# Patient Record
Sex: Male | Born: 1990 | Race: White | Hispanic: No | Marital: Single | Smoking: Never smoker
Health system: Southern US, Community
[De-identification: ages and names within clinical notes are randomized; demographics above are authoritative.]

## PROBLEM LIST (undated history)

## (undated) DIAGNOSIS — G43909 Migraine, unspecified, not intractable, without status migrainosus: Secondary | ICD-10-CM

## (undated) DIAGNOSIS — Z9889 Other specified postprocedural states: Secondary | ICD-10-CM

## (undated) DIAGNOSIS — J9811 Atelectasis: Secondary | ICD-10-CM

## (undated) DIAGNOSIS — G2581 Restless legs syndrome: Secondary | ICD-10-CM

## (undated) DIAGNOSIS — N35919 Unspecified urethral stricture, male, unspecified site: Secondary | ICD-10-CM

## (undated) DIAGNOSIS — K8020 Calculus of gallbladder without cholecystitis without obstruction: Secondary | ICD-10-CM

## (undated) DIAGNOSIS — J45909 Unspecified asthma, uncomplicated: Secondary | ICD-10-CM

## (undated) DIAGNOSIS — R251 Tremor, unspecified: Secondary | ICD-10-CM

## (undated) DIAGNOSIS — Z8739 Personal history of other diseases of the musculoskeletal system and connective tissue: Secondary | ICD-10-CM

## (undated) DIAGNOSIS — I7774 Dissection of vertebral artery: Secondary | ICD-10-CM

## (undated) DIAGNOSIS — E559 Vitamin D deficiency, unspecified: Secondary | ICD-10-CM

## (undated) DIAGNOSIS — G4700 Insomnia, unspecified: Secondary | ICD-10-CM

## (undated) DIAGNOSIS — F32A Depression, unspecified: Secondary | ICD-10-CM

## (undated) DIAGNOSIS — K219 Gastro-esophageal reflux disease without esophagitis: Secondary | ICD-10-CM

## (undated) HISTORY — PX: CHEST TUBE INSERTION: SHX231

## (undated) HISTORY — PX: URETHRAL FISTULA REPAIR: SHX2619

## (undated) HISTORY — PX: WISDOM TOOTH EXTRACTION: SHX21

## (undated) HISTORY — PX: URETHRA SURGERY: SHX824

## (undated) HISTORY — PX: URETHRAL STRICTURE DILATATION: SHX477

---
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
## 2008-04-12 HISTORY — PX: CHEST TUBE INSERTION: SHX231

## 2008-07-16 ENCOUNTER — Ambulatory Visit: Payer: Self-pay | Admitting: Diagnostic Radiology

## 2008-07-16 ENCOUNTER — Emergency Department (HOSPITAL_BASED_OUTPATIENT_CLINIC_OR_DEPARTMENT_OTHER): Admission: EM | Admit: 2008-07-16 | Discharge: 2008-07-16 | Payer: Self-pay | Admitting: Emergency Medicine

## 2010-12-16 ENCOUNTER — Ambulatory Visit (HOSPITAL_BASED_OUTPATIENT_CLINIC_OR_DEPARTMENT_OTHER)
Admission: RE | Admit: 2010-12-16 | Discharge: 2010-12-16 | Disposition: A | Discharge status: Home / Self Care | Payer: Managed Care, Other (non HMO) | Source: Ambulatory Visit | Attending: Urology | Admitting: Urology

## 2010-12-16 DIAGNOSIS — N35919 Unspecified urethral stricture, male, unspecified site: Secondary | ICD-10-CM | POA: Insufficient documentation

## 2010-12-16 DIAGNOSIS — Q549 Hypospadias, unspecified: Secondary | ICD-10-CM | POA: Insufficient documentation

## 2010-12-16 DIAGNOSIS — Z01812 Encounter for preprocedural laboratory examination: Secondary | ICD-10-CM | POA: Insufficient documentation

## 2010-12-16 LAB — POCT HEMOGLOBIN-HEMACUE: Hemoglobin: 14.3 g/dL (ref 13.0–17.0)
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
## 2011-04-01 ENCOUNTER — Emergency Department: Payer: Self-pay | Admitting: Unknown Physician Specialty

## 2011-10-05 ENCOUNTER — Emergency Department: Payer: Self-pay | Admitting: Emergency Medicine

## 2011-10-05 LAB — BASIC METABOLIC PANEL
BUN: 12 mg/dL (ref 7–18)
Calcium, Total: 8.9 mg/dL (ref 8.5–10.1)
Chloride: 103 mmol/L (ref 98–107)
Creatinine: 0.99 mg/dL (ref 0.60–1.30)
EGFR (African American): >60
EGFR (Non-African Amer.): >60
Potassium: 3.3 mmol/L — ABNORMAL LOW (ref 3.5–5.1)

## 2011-10-05 LAB — CBC WITH DIFFERENTIAL/PLATELET
Basophil #: 0 10*3/uL (ref 0.0–0.1)
Eosinophil %: 2.5 %
HCT: 38.9 % — ABNORMAL LOW (ref 40.0–52.0)
Lymphocyte #: 1.6 10*3/uL (ref 1.0–3.6)
Lymphocyte %: 28.1 %
MCH: 30 pg (ref 26.0–34.0)
MCHC: 34.7 g/dL (ref 32.0–36.0)
MCV: 87 fL (ref 80–100)
Monocyte %: 8.1 %
Neutrophil %: 60.9 %
Platelet: 159 10*3/uL (ref 150–440)
RBC: 4.49 10*6/uL (ref 4.40–5.90)
RDW: 13.2 % (ref 11.5–14.5)

## 2011-10-06 ENCOUNTER — Ambulatory Visit: Payer: Self-pay | Admitting: Orthopedic Surgery

## 2012-02-17 ENCOUNTER — Emergency Department: Payer: Self-pay | Admitting: Emergency Medicine

## 2012-02-17 LAB — CBC
MCV: 88 fL (ref 80–100)
Platelet: 180 10*3/uL (ref 150–440)
RBC: 4.73 10*6/uL (ref 4.40–5.90)
RDW: 13.2 % (ref 11.5–14.5)
WBC: 4.3 10*3/uL (ref 3.8–10.6)

## 2012-02-17 LAB — COMPREHENSIVE METABOLIC PANEL
Anion Gap: 5 — ABNORMAL LOW (ref 7–16)
BUN: 11 mg/dL (ref 7–18)
Calcium, Total: 9.3 mg/dL (ref 8.5–10.1)
Chloride: 106 mmol/L (ref 98–107)
Co2: 32 mmol/L (ref 21–32)
EGFR (African American): >60
EGFR (Non-African Amer.): >60
Glucose: 99 mg/dL (ref 65–99)
Potassium: 4.6 mmol/L (ref 3.5–5.1)
SGOT(AST): 24 U/L (ref 15–37)
Total Protein: 7 g/dL (ref 6.4–8.2)

## 2012-02-17 LAB — LIPASE, BLOOD: Lipase: 222 U/L (ref 73–393)

## 2012-02-17 LAB — URINALYSIS, COMPLETE
Bilirubin,UR: NEGATIVE
Ketone: NEGATIVE
Ph: 9 (ref 4.5–8.0)
Protein: NEGATIVE
RBC,UR: <1 /HPF (ref 0–5)
Specific Gravity: 1.01 (ref 1.003–1.030)
Squamous Epithelial: <1

## 2014-08-04 NOTE — Consult Note (Signed)
Brief Consult Note: Diagnosis: Left knee/thigh pain.   Patient was seen by consultant.   Recommend further assessment or treatment.   Orders entered.   Discussed with Attending MD.   Comments: Discussed case with Dr. Dorothea GlassmanPaul Malinda in ER.  Patient is a 24y/o male who had a heavy pallet fall on a flexed leftknee today at work.  Patient had pain in the left knee and was unable to walk after the injury.  In the ER, patient describes pain in the distal 1/3 of the left thigh.  He has mild paresthesias in his foot.   On exam of the left thigh, patient has a linear area of erythema where he was hit by the pallet.  There is no obvious deformity.  He has no sigificant swelling and his leg and thight compartments are soft.  He has point tenderness of the distal femur in the area of the quadriceps tendon.  He has intact senation to light touch, palpable pedal pulses and can flex and extend his toes.  His knee is most comfortable in a flexed position of approximately 90 degrees.  He has pain when he attempts to extend his knee.  There is no ecchymosis.  Radiographs of the knee and distal femur show no evidence of fracture or dislocation.  Dr. Darnelle CatalanMalinda noted an inability of the patient to actively hold the knee extended when he first presented.  Given this information and the location of impact on the left thigh, an injury to the quadriceps tendon is possible.  The hosptial was unable to get an MRI at this time, so the patient will be discharged in a knee immobilizer on crutches and get an MRI in the AM as an outpatient.  He will follow-up in my office within the next 7-10 days when we will reexamine the knee and review the MRI results with the patient.  Electronic Signatures: [name], Phillip Patterson (MD)  (Signed 25-Jun-13 20:29)  Authored: Brief Consult Note   Last Updated: 25-Jun-13 20:29 by [name], [name] (MD)

## 2014-11-20 ENCOUNTER — Other Ambulatory Visit: Payer: Self-pay | Admitting: Surgery

## 2014-11-20 DIAGNOSIS — R590 Localized enlarged lymph nodes: Secondary | ICD-10-CM

## 2014-11-20 DIAGNOSIS — M79621 Pain in right upper arm: Secondary | ICD-10-CM

## 2014-11-26 ENCOUNTER — Ambulatory Visit
Admission: RE | Admit: 2014-11-26 | Discharge: 2014-11-26 | Disposition: A | Discharge status: Home / Self Care | Payer: BLUE CROSS/BLUE SHIELD | Source: Ambulatory Visit | Attending: Surgery | Admitting: Surgery

## 2014-11-26 DIAGNOSIS — M79621 Pain in right upper arm: Secondary | ICD-10-CM

## 2014-11-26 DIAGNOSIS — R590 Localized enlarged lymph nodes: Secondary | ICD-10-CM | POA: Insufficient documentation

## 2014-12-06 ENCOUNTER — Other Ambulatory Visit: Payer: Self-pay | Admitting: Surgery

## 2014-12-06 ENCOUNTER — Ambulatory Visit
Admission: RE | Admit: 2014-12-06 | Discharge: 2014-12-06 | Disposition: A | Discharge status: Home / Self Care | Payer: BLUE CROSS/BLUE SHIELD | Source: Ambulatory Visit | Attending: Surgery | Admitting: Surgery

## 2014-12-06 DIAGNOSIS — Z9889 Other specified postprocedural states: Secondary | ICD-10-CM | POA: Insufficient documentation

## 2014-12-06 DIAGNOSIS — M79601 Pain in right arm: Secondary | ICD-10-CM

## 2015-04-04 ENCOUNTER — Ambulatory Visit
Admission: EM | Admit: 2015-04-04 | Discharge: 2015-04-04 | Disposition: A | Discharge status: Home / Self Care | Payer: BLUE CROSS/BLUE SHIELD | Attending: Family Medicine | Admitting: Family Medicine

## 2015-04-04 ENCOUNTER — Encounter: Payer: Self-pay | Admitting: Emergency Medicine

## 2015-04-04 DIAGNOSIS — J018 Other acute sinusitis: Secondary | ICD-10-CM | POA: Diagnosis not present

## 2015-04-04 MED ORDER — AMOXICILLIN 500 MG PO CAPS: 500.0000 mg | ORAL_CAPSULE | Freq: Three times a day (TID) | ORAL | Status: DC

## 2015-04-04 NOTE — ED Provider Notes (Signed)
Patient presents today with symptoms of sinus congestion, sinus pressure for the last week. Patient has had colored mucus and blood from the nose. He denies any sore throat or fever. He has had some minimal cough. Denies any chest pain, shortness of breath, nausea, vomiting, diarrhea. He believes his symptoms started after taking care of his brothers cats. He denies allergies to cats in the past. He has taken DayQuil for his symptoms.  ROS: Negative except mentioned above.  Vitals as per Epic. GENERAL: NAD HEENT: no pharyngeal erythema, no exudate, no erythema of TMs, mild maxillary sinus tenderness, small blood clot in right nare, no active bleeding, no cervical LAD RESP: CTA B CARD: RRR NEURO: CN II-XII grossly intact   A/P: Sinusitis- Amoxicillin, Claritin-D when necessary, rest, hydration, seek medical attention if symptoms persist or worsen as discussed. Will also try using a humidifier.  Jolene ProvostKirtida Johnye Kist, MD 04/04/15 1700

## 2015-04-04 NOTE — ED Notes (Signed)
Congestion, pressure, drainage for 1 week

## 2016-11-14 ENCOUNTER — Emergency Department
Admission: EM | Admit: 2016-11-14 | Discharge: 2016-11-14 | Disposition: A | Discharge status: Home / Self Care | Payer: BLUE CROSS/BLUE SHIELD | Attending: Emergency Medicine | Admitting: Emergency Medicine

## 2016-11-14 ENCOUNTER — Emergency Department: Payer: BLUE CROSS/BLUE SHIELD

## 2016-11-14 ENCOUNTER — Encounter: Payer: Self-pay | Admitting: Emergency Medicine

## 2016-11-14 DIAGNOSIS — S29019A Strain of muscle and tendon of unspecified wall of thorax, initial encounter: Secondary | ICD-10-CM | POA: Diagnosis not present

## 2016-11-14 DIAGNOSIS — Y999 Unspecified external cause status: Secondary | ICD-10-CM | POA: Insufficient documentation

## 2016-11-14 DIAGNOSIS — Y929 Unspecified place or not applicable: Secondary | ICD-10-CM | POA: Diagnosis not present

## 2016-11-14 DIAGNOSIS — S39012A Strain of muscle, fascia and tendon of lower back, initial encounter: Secondary | ICD-10-CM | POA: Insufficient documentation

## 2016-11-14 DIAGNOSIS — Y939 Activity, unspecified: Secondary | ICD-10-CM | POA: Insufficient documentation

## 2016-11-14 DIAGNOSIS — S3992XA Unspecified injury of lower back, initial encounter: Secondary | ICD-10-CM | POA: Diagnosis present

## 2016-11-14 DIAGNOSIS — X500XXA Overexertion from strenuous movement or load, initial encounter: Secondary | ICD-10-CM | POA: Insufficient documentation

## 2016-11-14 MED ORDER — METHOCARBAMOL 500 MG PO TABS: ORAL_TABLET | ORAL | 0 refills | Status: DC

## 2016-11-14 MED ORDER — IBUPROFEN 800 MG PO TABS
800.0000 mg | ORAL_TABLET | Freq: Once | ORAL | Status: AC
  Administered 2016-11-14: 800 mg via ORAL
  Filled 2016-11-14: qty 1

## 2016-11-14 MED ORDER — IBUPROFEN 600 MG PO TABS: 600.0000 mg | ORAL_TABLET | Freq: Three times a day (TID) | ORAL | 0 refills | Status: DC | PRN

## 2016-11-14 NOTE — ED Notes (Signed)
Patient transported to X-ray 

## 2016-11-14 NOTE — ED Triage Notes (Addendum)
Back pain since yesterday, states he does a lot of heavy lifting at work and does not remember injuring his back. Pt states it is uncomfortable for an position to sit in. Pt c/o mid to lower back pain that radiates to neck. Pain is worse with movement. Denies any numbness or tingling in lower extremities and no problems with voiding. Pt ambulatory in lobby

## 2016-11-14 NOTE — Discharge Instructions (Signed)
Follow-[facility] with your [facility] care doctor at [facility] if any continued problems. Begin taking Robaxin 1-2 tablets every 6 hours as needed for muscle spasms. Do not drive while taking this medication. Ibuprofen 600 mg 3 times a day with food. Use moist heat or ice to your back as needed for comfort.
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
## 2017-04-12 DIAGNOSIS — M359 Systemic involvement of connective tissue, unspecified: Secondary | ICD-10-CM

## 2017-04-12 HISTORY — DX: Systemic involvement of connective tissue, unspecified: M35.9

## 2017-08-19 ENCOUNTER — Emergency Department
Admission: EM | Admit: 2017-08-19 | Discharge: 2017-08-19 | Disposition: A | Discharge status: Home / Self Care | Payer: BLUE CROSS/BLUE SHIELD | Attending: Emergency Medicine | Admitting: Emergency Medicine

## 2017-08-19 ENCOUNTER — Encounter: Payer: Self-pay | Admitting: Emergency Medicine

## 2017-08-19 DIAGNOSIS — R55 Syncope and collapse: Secondary | ICD-10-CM | POA: Diagnosis present

## 2017-08-19 LAB — CBC
HCT: 42.2 % (ref 40.0–52.0)
HEMOGLOBIN: 14.9 g/dL (ref 13.0–18.0)
MCH: 31.3 pg (ref 26.0–34.0)
MCHC: 35.2 g/dL (ref 32.0–36.0)
MCV: 88.8 fL (ref 80.0–100.0)
Platelets: 199 10*3/uL (ref 150–440)
RBC: 4.76 MIL/uL (ref 4.40–5.90)
RDW: 13.1 % (ref 11.5–14.5)
WBC: 6.2 10*3/uL (ref 3.8–10.6)

## 2017-08-19 LAB — BASIC METABOLIC PANEL
ANION GAP: 8 (ref 5–15)
BUN: 14 mg/dL (ref 6–20)
CO2: 28 mmol/L (ref 22–32)
Calcium: 9.2 mg/dL (ref 8.9–10.3)
Chloride: 102 mmol/L (ref 101–111)
Creatinine, Ser: 0.77 mg/dL (ref 0.61–1.24)
GFR calc Af Amer: >60 mL/min (ref >60)
GLUCOSE: 100 mg/dL — AB (ref 65–99)
Potassium: 3.6 mmol/L (ref 3.5–5.1)
Sodium: 138 mmol/L (ref 135–145)

## 2017-08-19 LAB — TSH: TSH: 1.224 u[IU]/mL (ref 0.350–4.500)

## 2017-08-19 LAB — TROPONIN I

## 2017-08-19 MED ORDER — SODIUM CHLORIDE 0.9 % IV BOLUS
1000.0000 mL | Freq: Once | INTRAVENOUS | Status: AC
  Administered 2017-08-19: 1000 mL via INTRAVENOUS

## 2017-08-19 NOTE — ED Triage Notes (Signed)
Patient presents to the ED via EMS for near syncopal episode while at work.  Patient works in Network engineer.  Patient felt like he was having palpitations and became diaphoretic and confused per other staff members at patient's work.  Patient states this has happened to him before.  Patient states he has been more shaky over the last few months, usually feels better after he eats.  Patient's sister who is 29 years old was recently diagnosed with afib.

## 2017-08-19 NOTE — ED Provider Notes (Signed)
[facility] Emergency Department Provider Note  ____________________________________________  Time seen: Approximately 1:55 PM  I have reviewed the triage vital signs and the nursing notes.   HISTORY  Chief Complaint Near Syncope   HPI Phillip Patterson is a 27 y.o. male no significant past medical history who presents for evaluation of a syncopal episode.  Patient reports that he was at work when he started having palpitations,/fluttering sensation in his chest associated with mild chest pressure in the center of his chest and dizziness.  He started feeling very hot.  He went to his boss's office and sat down.  He then had a syncopal episode while sitting on a chair.  He does not sustain any injuries.  Next thing he remembers is waking up with the paramedics at the bedside.  Patient reports that he has been having episodes where he feels very shaky and that usually resolves after he eats something at least 3 times a week for several weeks.  Never had a syncopal episode and never had fluttering before. His sister who is 67 years old was recently diagnosed with A. fib.  He endorses polyuria and polydipsia.  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  He works indoors.  He tries to keep himself hydrated.  He reports drinking 1 alcoholic beverage a week. He does not smoke and denies drug use.  No family history of sudden death or congenital deafness.  He does have a strong family history of diabetes.  He denies any family history of ischemic heart disease or blood clots.  He denies any chest pain at this time.  Patient reports that he feels back to baseline at this time with no palpitations, no dizziness, no chest pain.  PMH Hypospadia   Past Surgical History:  Procedure Laterality Date  . CHEST TUBE INSERTION    . URETHRA SURGERY      Prior to Admission medications   Not on File    Allergies Aspirin  FH Mother- DM, HTN Sister -  afib  Social History Social History   Tobacco Use  . Smoking status: Never Smoker  . Smokeless tobacco: Never Used  Substance Use Topics  . Alcohol use: Yes  . Drug use: No    Review of Systems  Constitutional: Negative for fever. + Dizziness and syncope Eyes: Negative for visual changes. ENT: Negative for sore throat. Neck: No neck pain  Cardiovascular: + chest pressure, fluttering Respiratory: Negative for shortness of breath. Gastrointestinal: Negative for abdominal pain, vomiting or diarrhea. Genitourinary: Negative for dysuria. Musculoskeletal: Negative for back pain. Skin: Negative for rash. Neurological: Negative for headaches, weakness or numbness. Psych: No SI or HI  ____________________________________________   PHYSICAL EXAM:  VITAL SIGNS: ED Triage Vitals [08/19/17 1337]  Enc Vitals Group     BP 137/74     Pulse Rate 68     Resp 16     Temp 97.9 F (36.6 C)     Temp Source Oral     SpO2 96 %     Weight 132 lb 7.9 oz (60.1 kg)     Height  (1.854 m)     Head Circumference      Peak Flow      Pain Score 0     Pain Loc      Pain Edu?      Excl. in GC?    Orthostatic VS: Laying: HR 65  BP 120/86 Standing:  HR 75  BP 126/85  Constitutional: Alert and oriented. Well appearing and in no apparent distress. HEENT:      Head: Normocephalic and atraumatic.         Eyes: Conjunctivae are normal. Sclera is non-icteric.       Mouth/Throat: Mucous membranes are moist.       Neck: Supple with no signs of meningismus. Cardiovascular: Regular rate and rhythm. No murmurs, gallops, or rubs. 2+ symmetrical distal pulses are present in all extremities. No JVD. Respiratory: Normal respiratory effort. Lungs are clear to auscultation bilaterally. No wheezes, crackles, or rhonchi.  Gastrointestinal: Soft, non tender, and non distended with positive bowel sounds. No rebound or guarding. Musculoskeletal: Nontender with normal range of motion in all extremities. No  edema, cyanosis, or erythema of extremities. Neurologic: Normal speech and language. Face is symmetric. Moving all extremities. No gross focal neurologic deficits are appreciated. Skin: Skin is warm, dry and intact. No rash noted. Psychiatric: Mood and affect are normal. Speech and behavior are normal.  ____________________________________________   LABS (all labs ordered are listed, but only abnormal results are displayed)  Labs Reviewed  BASIC METABOLIC PANEL - Abnormal; Notable for the following components:      Result Value   Glucose, Bld 100 (*)    All other components within normal limits  CBC  TROPONIN I  TSH  URINALYSIS, COMPLETE (UACMP) WITH MICROSCOPIC  URINE DRUG SCREEN, QUALITATIVE ([facility] ONLY)   ____________________________________________  EKG  ED ECG REPORT I, Nita Sickle, the attending physician, personally viewed and interpreted this ECG.  Normal sinus rhythm, normal intervals, normal axis, no STE or depressions, no evidence of HOCM, AV block, delta wave, ARVD, prolonged QTc, WPW, or Brugada.   ____________________________________________  RADIOLOGY  none  ____________________________________________   PROCEDURES  Procedure(s) performed: None Procedures Critical Care performed:  None ____________________________________________   INITIAL IMPRESSION / ASSESSMENT AND PLAN / ED COURSE   27 y.o. male no significant past medical history who presents for evaluation of a syncopal episode preceded by dizziness, fluttering sensation in his chest and chest pressure.  Patient is asymptomatic at this time.  Orthostatic vital signs show change in his heart rate by 10 bpm but stable BP. Patient did not feel dizzy. EKG showing no evidence of ischemia or dysrhythmias.  Will monitor patient on cardiac telemetry.  Will check labs to rule out electrolyte abnormalities, anemia, dehydration, new onset of diabetes, or cardiac ischemia.  Will give IV fluids.  If work-up  is negative will discharge patient with follow-up with cardiology.  Differential diagnosis including vasovagal versus orthostatic dehydration versus cardiac arrhythmia versus anemia.    _________________________ 3:23 PM on 08/19/2017 -----------------------------------------  I have personally review patient's telemetry with no evidence of arrhythmia.  Patient was monitored for 2 hours.  Labs including CBC, BMP, troponin, TSH are all within normal limits.  Patient is going to be referred to cardiology for further monitoring.  Discussed return precautions with him.   As part of my medical decision making, I reviewed the following data within the electronic MEDICAL RECORD NUMBER Nursing notes reviewed and incorporated, Labs reviewed , EKG interpreted , Notes from prior ED visits and [place] Controlled Substance Database    Pertinent labs & imaging results that were available during my care of the patient were reviewed by me and considered in my medical decision making (see chart for details).    ____________________________________________   FINAL CLINICAL IMPRESSION(S) / ED DIAGNOSES  Final diagnoses:  Syncope, unspecified syncope type  NEW MEDICATIONS STARTED DURING THIS VISIT:  ED Discharge Orders    None       Note:  This document was prepared using Dragon voice recognition software and may include unintentional dictation errors.    Nita Sickle, MD 08/19/17 [phone]

## 2018-02-06 ENCOUNTER — Other Ambulatory Visit: Payer: Self-pay | Admitting: Family Medicine

## 2018-02-06 DIAGNOSIS — M542 Cervicalgia: Secondary | ICD-10-CM

## 2018-02-08 ENCOUNTER — Ambulatory Visit
Admission: RE | Admit: 2018-02-08 | Discharge: 2018-02-08 | Disposition: A | Discharge status: Home / Self Care | Payer: BLUE CROSS/BLUE SHIELD | Source: Ambulatory Visit | Attending: Family Medicine | Admitting: Family Medicine

## 2018-02-08 ENCOUNTER — Other Ambulatory Visit: Payer: Self-pay

## 2018-02-08 ENCOUNTER — Emergency Department
Admission: EM | Admit: 2018-02-08 | Discharge: 2018-02-08 | Disposition: A | Discharge status: Home / Self Care | Payer: BLUE CROSS/BLUE SHIELD | Attending: Emergency Medicine | Admitting: Emergency Medicine

## 2018-02-08 ENCOUNTER — Encounter: Payer: Self-pay | Admitting: Emergency Medicine

## 2018-02-08 ENCOUNTER — Encounter (INDEPENDENT_AMBULATORY_CARE_PROVIDER_SITE_OTHER): Payer: Self-pay

## 2018-02-08 DIAGNOSIS — I7774 Dissection of vertebral artery: Secondary | ICD-10-CM

## 2018-02-08 DIAGNOSIS — M5412 Radiculopathy, cervical region: Secondary | ICD-10-CM

## 2018-02-08 DIAGNOSIS — M542 Cervicalgia: Secondary | ICD-10-CM | POA: Diagnosis present

## 2018-02-08 HISTORY — DX: Atelectasis: J98.11

## 2018-02-08 LAB — CBC WITH DIFFERENTIAL/PLATELET
ABS IMMATURE GRANULOCYTES: 0.02 10*3/uL (ref 0.00–0.07)
BASOS ABS: 0 10*3/uL (ref 0.0–0.1)
Basophils Relative: 0 %
Eosinophils Absolute: 0.2 10*3/uL (ref 0.0–0.5)
Eosinophils Relative: 4 %
HCT: 42.4 % (ref 39.0–52.0)
HEMOGLOBIN: 14.6 g/dL (ref 13.0–17.0)
IMMATURE GRANULOCYTES: 0 %
LYMPHS PCT: 35 %
Lymphs Abs: 2 10*3/uL (ref 0.7–4.0)
MCH: 30.5 pg (ref 26.0–34.0)
MCHC: 34.4 g/dL (ref 30.0–36.0)
MCV: 88.5 fL (ref 80.0–100.0)
MONO ABS: 0.5 10*3/uL (ref 0.1–1.0)
Monocytes Relative: 10 %
NEUTROS ABS: 2.8 10*3/uL (ref 1.7–7.7)
NEUTROS PCT: 51 %
Platelets: 228 10*3/uL (ref 150–400)
RBC: 4.79 MIL/uL (ref 4.22–5.81)
RDW: 12.1 % (ref 11.5–15.5)
WBC: 5.6 10*3/uL (ref 4.0–10.5)
nRBC: 0 % (ref 0.0–0.2)

## 2018-02-08 LAB — TYPE AND SCREEN
ABO/RH(D): O POS
Antibody Screen: NEGATIVE

## 2018-02-08 LAB — BASIC METABOLIC PANEL
ANION GAP: 3 — AB (ref 5–15)
BUN: 11 mg/dL (ref 6–20)
CHLORIDE: 102 mmol/L (ref 98–111)
CO2: 34 mmol/L — ABNORMAL HIGH (ref 22–32)
Calcium: 9.1 mg/dL (ref 8.9–10.3)
Creatinine, Ser: 0.82 mg/dL (ref 0.61–1.24)
Glucose, Bld: 89 mg/dL (ref 70–99)
POTASSIUM: 3.6 mmol/L (ref 3.5–5.1)
SODIUM: 139 mmol/L (ref 135–145)

## 2018-02-08 MED ORDER — CLOPIDOGREL BISULFATE 75 MG PO TABS: 75.0000 mg | ORAL_TABLET | Freq: Every day | ORAL | 2 refills | Status: AC

## 2018-02-08 MED ORDER — ASPIRIN EC 325 MG PO TBEC: 325.0000 mg | DELAYED_RELEASE_TABLET | Freq: Every day | ORAL | 3 refills | Status: AC

## 2018-02-08 MED ORDER — ASPIRIN 81 MG PO CHEW
CHEWABLE_TABLET | ORAL | Status: AC
  Administered 2018-02-08: 324 mg
  Filled 2018-02-08: qty 4

## 2018-02-08 MED ORDER — CLOPIDOGREL BISULFATE 75 MG PO TABS
75.0000 mg | ORAL_TABLET | Freq: Once | ORAL | Status: AC
  Administered 2018-02-08: 75 mg via ORAL
  Filled 2018-02-08: qty 1

## 2018-02-08 NOTE — ED Notes (Signed)
Patient discharged to home per MD order. Patient in stable condition, and deemed medically cleared by ED provider for discharge. Discharge instructions reviewed with patient/family using "Teach Back"; verbalized understanding of medication education and administration, and information about follow-up care. Denies further concerns. ° °

## 2018-02-08 NOTE — ED Triage Notes (Signed)
Dr. Yves Dill, Ortho MD called and reported pt with c/o left neck pain with no injury. Pt received a MRI and the was called today and advised to come to the ED for a vertebral artery dissection.

## 2018-02-08 NOTE — ED Triage Notes (Signed)
Here for large left artery dissection. Has had neck pain X 1 month and found with MRI today. Neck pain intermittent. VSS at this time.

## 2018-02-08 NOTE — ED Notes (Signed)
Pt states he has had neck pain states to right and left side for 1 month. Denies any injury, has seen pmd for the same and given muscles relaxants and steroids without relief. States is on neurontin x 3 days which has helped with neck pain. Pt states he had MRI earlier today outpt per pmd and was called at home to come to ED to due abnormal results. 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

## 2018-02-08 NOTE — Discharge Instructions (Addendum)
Please return to the emergency department for any numbness or weakness in any extremity, slurred speech or change in vision.  Please seek medical attention for any head trauma while you are taking this medication or for any excessive bleeding.

## 2018-02-10 NOTE — ED Provider Notes (Signed)
[facility] Emergency Department Provider Note  ____________________________________________   I have reviewed the triage vital signs and the nursing notes.   HISTORY  Chief Complaint Neck Pain   History limited by: Not Limited   HPI Phillip Patterson is a 27 y.o. male who presents to the emergency department today because of concerns for vertebral artery dissection found on the cervical MRI performed today.  Patient states that for roughly the past month he has been having left neck pain.  He denies any trauma prior to the neck pain starting.  When the pain first started he did have some left shoulder pain and some left arm numbness although the shoulder and arm numbness had resolved fairly quickly.  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   Per medical record review patient has a history of pneumothorax  Past Medical History:  Diagnosis Date  . Collapse of both lungs     There are no active problems to display for this patient.   Past Surgical History:  Procedure Laterality Date  . CHEST TUBE INSERTION    . URETHRA SURGERY    . URETHRAL FISTULA REPAIR      Prior to Admission medications   Medication Sig Start Date End Date Taking? Authorizing Provider  cyclobenzaprine (FLEXERIL) 5 MG tablet Take 1 tablet by mouth 3 (three) times daily as needed. 01/30/18  Yes [provider]  gabapentin (NEURONTIN) 300 MG capsule Take 1 capsule by mouth 3 (three) times daily. 02/06/18  Yes [provider]  HYDROcodone-acetaminophen (NORCO/VICODIN) 5-325 MG tablet Take 1 tablet by mouth every 6 (six) hours as needed. 01/30/18  Yes [provider]  tiZANidine (ZANAFLEX) 4 MG tablet Take 1 tablet by mouth 3 (three) times daily as needed. 01/24/18  Yes [provider]  traMADol (ULTRAM) 50 MG tablet Take 1 tablet by mouth 2 (two) times daily  as needed. 02/06/18  Yes [provider]  aspirin EC 325 MG tablet Take 1 tablet (325 mg total) by mouth daily. 02/08/18 02/08/19  Phineas Semen, MD  clopidogrel (PLAVIX) 75 MG tablet Take 1 tablet (75 mg total) by mouth daily. 02/08/18 02/08/19  Phineas Semen, MD  predniSONE (DELTASONE) 10 MG tablet Take 1 tablet by mouth as directed. Taper 01/24/18   [provider]    Allergies Aspirin  History reviewed. No pertinent family history.  Social History Social History   Tobacco Use  . Smoking status: Never Smoker  . Smokeless tobacco: Never Used  Substance Use Topics  . Alcohol use: Yes  . Drug use: No    Review of Systems Constitutional: No fever/chills Eyes: No visual changes. ENT: Positive for left neck pain. Cardiovascular: Denies chest pain. Respiratory: Denies shortness of breath. Gastrointestinal: No abdominal pain.  No nausea, no vomiting.  No diarrhea.   Genitourinary: Negative for dysuria. Musculoskeletal: Negative for back pain. Skin: Negative for rash. Neurological: Negative for headaches, focal weakness or numbness.  ____________________________________________   PHYSICAL EXAM:  VITAL SIGNS: ED Triage Vitals [02/08/18 2145]  Enc Vitals Group     BP (!) 118/53     Pulse Rate 78     Resp 16     Temp      Temp src      SpO2 99 %     Weight 140 lb (63.5 kg)     Height 6\' 1"  (1.854 m)  Head Circumference      Peak Flow      Pain Score 2   Constitutional: Alert and oriented.  Eyes: Conjunctivae are normal.  ENT      Head: Normocephalic and atraumatic.      Nose: No congestion/rhinnorhea.      Mouth/Throat: Mucous membranes are moist.      Neck: No stridor. Hematological/Lymphatic/Immunilogical: No cervical lymphadenopathy. Cardiovascular: Normal rate, regular rhythm.  No murmurs, rubs, or gallops. Respiratory: Normal respiratory effort without tachypnea nor retractions. Breath sounds are clear and equal bilaterally. No  wheezes/rales/rhonchi. Gastrointestinal: Soft and non tender. No rebound. No guarding.  Genitourinary: Deferred Musculoskeletal: Normal range of motion in all extremities. No lower extremity edema. Neurologic:  Normal speech and language. No gross focal neurologic deficits are appreciated.  Skin:  Skin is warm, dry and intact. No rash noted. Psychiatric: Mood and affect are normal. Speech and behavior are normal. Patient exhibits appropriate insight and judgment.  ____________________________________________    LABS (pertinent positives/negatives)  CBC wnl BMP wnl except co2 34  ____________________________________________   EKG  None  ____________________________________________    RADIOLOGY  None  ____________________________________________   PROCEDURES  Procedures  ____________________________________________   INITIAL IMPRESSION / ASSESSMENT AND PLAN / ED COURSE  Pertinent labs & imaging results that were available during my care of the patient were reviewed by me and considered in my medical decision making (see chart for details).   Patient presented to the emergency department today after MRI showed vertebral artery dissection.  Is likely has been present for the past month.  Discussed with Dr. Wyn Quaker.  Patient is neurologically intact.  At this point Dr. Wyn Quaker recommends antiplatelet therapy and follow-up in clinic.  Discussed plan with patient.  Discussed with patient/family results of testing/physical exam, differential plan and return precautions.  ____________________________________________   FINAL CLINICAL IMPRESSION(S) / ED DIAGNOSES  Final diagnoses:  Dissection, vertebral artery (HCC)     Note: This dictation was prepared with Dragon dictation. Any transcriptional errors that result from this process are unintentional     Phineas Semen, MD 02/10/18 0011

## 2018-02-14 ENCOUNTER — Ambulatory Visit (INDEPENDENT_AMBULATORY_CARE_PROVIDER_SITE_OTHER): Payer: BLUE CROSS/BLUE SHIELD | Admitting: Vascular Surgery

## 2018-02-14 ENCOUNTER — Encounter (INDEPENDENT_AMBULATORY_CARE_PROVIDER_SITE_OTHER): Payer: Self-pay | Admitting: Vascular Surgery

## 2018-02-14 VITALS — BP 103/61 | HR 73 | Resp 17 | Ht 73.0 in | Wt 140.0 lb

## 2018-02-14 DIAGNOSIS — M542 Cervicalgia: Secondary | ICD-10-CM | POA: Diagnosis not present

## 2018-02-14 DIAGNOSIS — I7774 Dissection of vertebral artery: Secondary | ICD-10-CM | POA: Insufficient documentation

## 2018-02-14 NOTE — Assessment & Plan Note (Signed)
Neurontin seems to be helping this.  No obvious findings on his MR scan other than the vertebral artery dissection on the left
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
## 2018-02-14 NOTE — Patient Instructions (Signed)
Vertebral Artery Dissection  Vertebral artery dissection is a tear in a vertebral artery. The vertebral arteries are major blood vessels at the base of the neck. They carry blood from the heart to the brain. When an artery tears, blood collects inside the layers of the artery wall. This can cause a blood clot.  This condition increases the risk of stroke if it is not diagnosed and treated right away. It is a common cause of stroke in people who are 30-45 years old.  What are the causes?  This condition may be caused by:  · A neck injury due to sudden or excessive neck movement. The injury may be mild or severe.  · Having weak blood vessel walls. The walls may tear even when no injury occurs (spontaneous dissection).    What increases the risk?  You may be more likely to have a spontaneous vertebral artery dissection if you:  · Have high blood pressure.  · Have a migraine disorder.  · Have certain inherited diseases or connective tissue disorders that weaken the blood vessels.  · Have fibromuscular dysplasia.    What are the signs or symptoms?  Symptoms usually appear within days of an injury, but sometimes they may not appear for weeks or years. Symptoms may include:  · Stabbing, sharp pain in the head, neck, eye, or face.  · Vertigo. This is a feeling that you or your surroundings are moving when they are not.  · Dizziness.  · Double vision.  · Difficulty speaking.  · Difficulty swallowing.  · Hoarse voice.  · Loss of feeling in the torso, legs, or arms.  · Loss of taste.  · Loss of balance.  · Hiccups.  · Nausea and vomiting.  · Hearing loss.  · Ear pain.    How is this diagnosed?  This condition is diagnosed with tests, such as:  · CT angiogram. This test uses a computer to take X-rays of your vertebral arteries. A dye may be injected into your blood to show the inside of your blood vessels more clearly.  · MRI angiogram. This is a type of MRI that is used to evaluate the blood vessels.  · Cerebral angiogram.  This test uses X-rays and a dye to show the blood vessels in the brain and neck.  · Doppler ultrasound. This test uses sound waves to show the vertebral arteries. The test will also show how well blood flows through your arteries.    How is this treated?  Treatment for this condition depends on the cause of your vertebral artery dissection and your overall health. The most important goal of treatment is to prevent a stroke. If you are having a stroke, it is important to get treatment quickly. Treatment may include:  · Blood thinners. This medicine helps to prevent blood clots. This may be given first through an IV tube, and then as pills for 3-6 months.  · Procedures to widen a narrow blood vessel (angioplasty) or to place a mesh tube (stent) inside the blood vessel to keep it open.  · Surgery to repair the area. This is rarely needed.    Follow these instructions at home:  · Work with your health care provider to control your blood pressure. This may involve:  ? Exercising regularly, as told by your health care provider. Check with your health care provider before starting any new type of exercise.  ? Eating a heart-healthy diet. This includes limiting unhealthy fats and eating more healthy   fats.  ? Reducing the amount of salt (sodium) that you eat to less than 1,500 mg a day.  ? Reducing stress by participating in things that you enjoy and avoiding things that cause you stress.  · Avoid activities that put you at risk for neck injuries, such as contact sports.  · Take over-the-counter and prescription medicines only as told by your health care provider.  · Do not use any tobacco products, such as cigarettes, chewing tobacco, and e-cigarettes. If you need help quitting, ask your health care provider.  · Keep all follow-up visits as told by your health care provider. This is important.  Get help right away if:  · You feel weak or dizzy.  · You have a sudden, severe headache with no known cause.  · You notice changes  in your vision or speech.  · You have difficulty breathing.  · You have a loss of balance or coordination.  · You have numbness in your face, arm, or leg.  · You have chest pain.  · You have a fever.  These symptoms may represent a serious problem that is an emergency. Do not wait to see if the symptoms will go away. Get medical help right away. Call your local emergency services (911 in the [place] Do not drive yourself to the hospital.  This information is not intended to replace advice given to you by your health care provider. Make sure you discuss any questions you have with your health care provider.  Document Released: 03/15/2012 Document Revised: 09/09/2015 Document Reviewed: 06/25/2015  Elsevier Interactive Patient Education © 2018 [employer]
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
## 2018-02-22 ENCOUNTER — Ambulatory Visit
Admission: RE | Admit: 2018-02-22 | Discharge: 2018-02-22 | Disposition: A | Discharge status: Home / Self Care | Payer: BLUE CROSS/BLUE SHIELD | Source: Ambulatory Visit | Attending: Vascular Surgery | Admitting: Vascular Surgery

## 2018-02-22 DIAGNOSIS — I7774 Dissection of vertebral artery: Secondary | ICD-10-CM | POA: Insufficient documentation

## 2018-02-22 MED ORDER — IOPAMIDOL (ISOVUE-370) INJECTION 76%
100.0000 mL | Freq: Once | INTRAVENOUS | Status: AC | PRN
  Administered 2018-02-22: 80 mL via INTRAVENOUS

## 2018-03-17 ENCOUNTER — Encounter (INDEPENDENT_AMBULATORY_CARE_PROVIDER_SITE_OTHER): Payer: Self-pay | Admitting: Vascular Surgery

## 2018-03-17 ENCOUNTER — Ambulatory Visit (INDEPENDENT_AMBULATORY_CARE_PROVIDER_SITE_OTHER): Payer: BLUE CROSS/BLUE SHIELD | Admitting: Vascular Surgery

## 2018-03-17 VITALS — BP 109/69 | HR 63 | Resp 17 | Ht 73.0 in | Wt 142.4 lb

## 2018-03-17 DIAGNOSIS — M542 Cervicalgia: Secondary | ICD-10-CM

## 2018-03-17 DIAGNOSIS — I7774 Dissection of vertebral artery: Secondary | ICD-10-CM | POA: Diagnosis not present

## 2018-03-17 NOTE — Progress Notes (Signed)
MRN : [id number]  Phillip Patterson is a 27 y.o. (14-Oct-1990) male who presents with chief complaint of  Chief Complaint  Patient presents with  . Follow-up    ct results  .  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  He is doing well without complaints.  He says he stopped taking his Plavix because it made him feel bad and lowered his blood pressure.  He really does not want to take any medicines that he does not have to.  He reports no new focal neurologic symptoms.  Since his last visit, he has undergone a CT angiogram which I have independently reviewed.  He has a stable left vertebral artery dissection which has not changed from his previous study a few weeks earlier.  No significant right vertebral disease or carotid artery disease.  Current Outpatient Medications  Medication Sig Dispense Refill  . aspirin EC 325 MG tablet Take 1 tablet (325 mg total) by mouth daily. (Patient taking differently: Take 81 mg by mouth daily. ) 100 tablet 3  . clopidogrel (PLAVIX) 75 MG tablet Take 1 tablet (75 mg total) by mouth daily. (Patient not taking: Reported on 03/17/2018) 30 tablet 2  . cyclobenzaprine (FLEXERIL) 5 MG tablet Take 1 tablet by mouth 3 (three) times daily as needed.  0  . gabapentin (NEURONTIN) 300 MG capsule Take 1 capsule by mouth 3 (three) times daily.  0  . HYDROcodone-acetaminophen (NORCO/VICODIN) 5-325 MG tablet Take 1 tablet by mouth every 6 (six) hours as needed.  0  . tiZANidine (ZANAFLEX) 4 MG tablet Take 1 tablet by mouth 3 (three) times daily as needed.  0  . traMADol (ULTRAM) 50 MG tablet Take 1 tablet by mouth 2 (two) times daily as needed.  0   No current facility-administered medications for this visit.     Past Medical History:  Diagnosis Date  . Collapse of both lungs     Past Surgical History:  Procedure Laterality Date  . CHEST TUBE INSERTION    . URETHRA SURGERY    . URETHRAL FISTULA REPAIR       Social History Social History   Tobacco Use  . Smoking status: Never Smoker  . Smokeless tobacco: Never Used  Substance Use Topics  . Alcohol use: Yes  . Drug use: No     Family History Family History  Problem Relation Age of Onset  . Diabetes Maternal Grandmother   . Hypertension Maternal Grandfather     Allergies  Allergen Reactions  . Aspirin Nausea And Vomiting   REVIEW OF SYSTEMS (Negative unless checked)  Constitutional: '[]'$ Weight loss  '[]'$ Fever  '[]'$ Chills , positive for neck pain and headaches Cardiac: '[]'$ Chest pain   '[]'$ Chest pressure   '[]'$ Palpitations   '[]'$ Shortness of breath when laying flat   '[]'$ Shortness of breath at rest   '[]'$ Shortness of breath with exertion. Vascular:  '[]'$ Pain in legs with walking   '[]'$ Pain in legs at rest   '[]'$ Pain in legs when laying flat   '[]'$ Claudication   '[]'$ Pain in feet when walking  '[]'$ Pain in feet at rest  '[]'$ Pain in feet when laying flat   '[]'$ History of DVT   '[]'$ Phlebitis   '[]'$ Swelling in legs   '[]'$ Varicose veins   '[]'$ Non-healing ulcers Pulmonary:   '[]'$ Uses home oxygen   '[]'$ Productive cough   '[]'$ Hemoptysis   '[]'$ Wheeze  '[]'$ COPD   '[]'$ Asthma Neurologic:  '[]'$ Dizziness  '[x]'$ Blackouts   '[]'$ Seizures   '[]'$ History of  stroke   '[]'$ History of TIA  '[]'$ Aphasia   '[]'$ Temporary blindness   '[]'$ Dysphagia   '[]'$ Weakness or numbness in arms   '[]'$ Weakness or numbness in legs Musculoskeletal:  '[]'$ Arthritis   '[]'$ Joint swelling   '[]'$ Joint pain   '[]'$ Low back pain Hematologic:  '[]'$ Easy bruising  '[]'$ Easy bleeding   '[]'$ Hypercoagulable state   '[]'$ Anemic  '[]'$ Hepatitis Gastrointestinal:  '[]'$ Blood in stool   '[]'$ Vomiting blood  '[]'$ Gastroesophageal reflux/heartburn   '[]'$ Abdominal pain Genitourinary:  '[]'$ Chronic kidney disease   '[]'$ Difficult urination  '[]'$ Frequent urination  '[]'$ Burning with urination   '[]'$ Hematuria Skin:  '[]'$ Rashes   '[]'$ Ulcers   '[]'$ Wounds Psychological:  '[]'$ History of anxiety   '[]'$  History of major depression.    Physical Examination  BP 109/69 (BP Location: Left Arm)   Pulse 63   Resp 17   Ht '6\' 1"'$   (1.854 m)   Wt 142 lb 6.4 oz (64.6 kg)   BMI 18.79 kg/m  Gen:  WD/WN, NAD. Tall and thin Head: [place]/AT, No temporalis wasting. Ear/Nose/Throat: Hearing grossly intact, nares w/o erythema or drainage Eyes: Conjunctiva clear. Sclera non-icteric Neck: Supple.  Trachea midline Pulmonary:  Good air movement, no use of accessory muscles.  Cardiac: RRR, no JVD Vascular:  Vessel Right Left  Radial Palpable Palpable                                   Gastrointestinal: soft, non-tender/non-distended. No guarding/reflex.  Musculoskeletal: M/S 5/5 throughout.  No deformity or atrophy. No edema. Neurologic: Sensation grossly intact in extremities.  Symmetrical.  Speech is fluent.  Psychiatric: Judgment intact, Mood & affect appropriate for pt's clinical situation. Dermatologic: No rashes or ulcers noted.  No cellulitis or open wounds.       Labs Recent Results (from the past 2160 hour(s))  CBC with Differential     Status: None   Collection Time: 02/08/18  9:56 PM  Result Value Ref Range   WBC 5.6 4.0 - 10.5 K/uL   RBC 4.79 4.22 - 5.81 MIL/uL   Hemoglobin 14.6 13.0 - 17.0 g/dL   HCT 42.4 39.0 - 52.0 %   MCV 88.5 80.0 - 100.0 fL   MCH 30.5 26.0 - 34.0 pg   MCHC 34.4 30.0 - 36.0 g/dL   RDW 12.1 11.5 - 15.5 %   Platelets 228 150 - 400 K/uL   nRBC 0.0 0.0 - 0.2 %   Neutrophils Relative % 51 %   Neutro Abs 2.8 1.7 - 7.7 K/uL   Lymphocytes Relative 35 %   Lymphs Abs 2.0 0.7 - 4.0 K/uL   Monocytes Relative 10 %   Monocytes Absolute 0.5 0.1 - 1.0 K/uL   Eosinophils Relative 4 %   Eosinophils Absolute 0.2 0.0 - 0.5 K/uL   Basophils Relative 0 %   Basophils Absolute 0.0 0.0 - 0.1 K/uL   Immature Granulocytes 0 %   Abs Immature Granulocytes 0.02 0.00 - 0.07 K/uL    Comment: Performed at [facility], [address] [place], [place] [address]  Basic metabolic panel     Status: Abnormal   Collection Time: 02/08/18  9:56 PM  Result Value Ref Range   Sodium 139 135 - 145  mmol/L   Potassium 3.6 3.5 - 5.1 mmol/L   Chloride 102 98 - 111 mmol/L   CO2 34 (H) 22 - 32 mmol/L   Glucose, Bld 89 70 - 99 mg/dL   BUN 11 6 - 20  mg/dL   Creatinine, Ser 0.82 0.61 - 1.24 mg/dL   Calcium 9.1 8.9 - 10.3 mg/dL   GFR calc non Af Amer >60 >60 mL/min   GFR calc Af Amer >60 >60 mL/min    Comment: (NOTE) The eGFR has been calculated using the CKD EPI equation. This calculation has not been validated in all clinical situations. eGFR's persistently <60 mL/min signify possible Chronic Kidney Disease.    Anion gap 3 (L) 5 - 15    Comment: Performed at [facility], [address] [place],  [address]  Type and screen [place]     Status: None   Collection Time: 02/08/18  9:57 PM  Result Value Ref Range   ABO/RH(D) O POS    Antibody Screen NEG    Sample Expiration      02/11/2018 Performed at [facility], [address] [place],  [address]     Radiology Ct Angio Neck W/cm &/or Wo/cm  Result Date: 02/22/2018 CLINICAL DATA:  Vertebral artery dissection EXAM: CT ANGIOGRAPHY NECK TECHNIQUE: Multidetector CT imaging of the neck was performed using the standard protocol during bolus administration of intravenous contrast. Multiplanar CT image reconstructions and MIPs were obtained to evaluate the vascular anatomy. Carotid stenosis measurements (when applicable) are obtained utilizing NASCET criteria, using the distal internal carotid diameter as the denominator. CONTRAST:  37m ISOVUE-370 IOPAMIDOL (ISOVUE-370) INJECTION 76% COMPARISON:  Cervical MRI 02/08/2018 FINDINGS: Aortic arch: Normal Right carotid system: Vessels are smooth and widely patent. No atheromatous changes or beading Left carotid system: Vessels are smooth and widely patent. No atheromatous changes or beading Vertebral arteries: No proximal subclavian stenosis or atherosclerosis. Undulating left vertebral artery lumen with intramural hematoma seen throughout  the V2 segment and distal V1 segment on previous cervical MRI. No flow limiting stenosis. Both vertebral arteries are patent to the dura. No beading seen in the right vertebral artery. Skeleton: Negative Other neck: Negative Upper chest: Negative IMPRESSION: 1. 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 The extent and appearance is similar to cervical MRI 02/08/2018. 2. The other vessels appear normal. Electronically Signed   By: JMonte FantasiaM.D.   On: 02/22/2018 14:19    Assessment/Plan Neck pain Neurontin seems to be helping this.  No obvious findings on his MR scan other than the vertebral artery dissection on the left  Dissection of vertebral artery (HCC) CT angiogram which I have independently reviewed.  He has a stable left vertebral artery dissection which has not changed from his previous study a few weeks earlier.  No significant right vertebral disease or carotid artery disease. At this point, he should continue antiplatelet therapy although I think we can do 81 mg daily of aspirin alone.  He did not tolerate Plavix.  I am more suspicious this is a chronic finding based off of a severe injury almost a decade ago.  He should be able to resume all normal activities at work without restriction.  I will see him back in about 6 months with a duplex for follow-up.    JLeotis Pain MD  03/17/2018 11:58 AM    This note was created with Dragon medical transcription system.  Any errors from dictation are purely unintentional

## 2018-03-17 NOTE — Assessment & Plan Note (Signed)
CT angiogram which I have independently reviewed.  He has a stable left vertebral artery dissection which has not changed from his previous study a few weeks earlier.  No significant right vertebral disease or carotid artery disease. At this point, he should continue antiplatelet therapy although I think we can do 81 mg daily of aspirin alone.  He did not tolerate Plavix.  I am more suspicious this is a chronic finding based off of a severe injury almost a decade ago.  He should be able to resume all normal activities at work without restriction.  I will see him back in about 6 months with a duplex for follow-up.

## 2018-04-16 ENCOUNTER — Emergency Department: Payer: Worker's Compensation

## 2018-04-16 ENCOUNTER — Emergency Department
Admission: EM | Admit: 2018-04-16 | Discharge: 2018-04-16 | Disposition: A | Discharge status: Home / Self Care | Payer: Worker's Compensation | Attending: Emergency Medicine | Admitting: Emergency Medicine

## 2018-04-16 ENCOUNTER — Other Ambulatory Visit: Payer: Self-pay

## 2018-04-16 DIAGNOSIS — Y939 Activity, unspecified: Secondary | ICD-10-CM | POA: Insufficient documentation

## 2018-04-16 DIAGNOSIS — S61532A Puncture wound without foreign body of left wrist, initial encounter: Secondary | ICD-10-CM | POA: Insufficient documentation

## 2018-04-16 DIAGNOSIS — Y929 Unspecified place or not applicable: Secondary | ICD-10-CM | POA: Diagnosis not present

## 2018-04-16 DIAGNOSIS — Z23 Encounter for immunization: Secondary | ICD-10-CM | POA: Insufficient documentation

## 2018-04-16 DIAGNOSIS — Z79899 Other long term (current) drug therapy: Secondary | ICD-10-CM | POA: Diagnosis not present

## 2018-04-16 DIAGNOSIS — W298XXA Contact with other powered powered hand tools and household machinery, initial encounter: Secondary | ICD-10-CM | POA: Diagnosis not present

## 2018-04-16 DIAGNOSIS — Y999 Unspecified external cause status: Secondary | ICD-10-CM | POA: Insufficient documentation

## 2018-04-16 DIAGNOSIS — T148XXA Other injury of unspecified body region, initial encounter: Secondary | ICD-10-CM

## 2018-04-16 HISTORY — DX: Other specified postprocedural states: Z98.890

## 2018-04-16 MED ORDER — AMOXICILLIN-POT CLAVULANATE 875-125 MG PO TABS: 1.0000 | ORAL_TABLET | Freq: Two times a day (BID) | ORAL | 0 refills | Status: AC

## 2018-04-16 MED ORDER — TETANUS-DIPHTH-ACELL PERTUSSIS 5-2.5-18.5 LF-MCG/0.5 IM SUSP
INTRAMUSCULAR | Status: AC
  Filled 2018-04-16: qty 0.5

## 2018-04-16 MED ORDER — TETANUS-DIPHTH-ACELL PERTUSSIS 5-2.5-18.5 LF-MCG/0.5 IM SUSP
0.5000 mL | Freq: Once | INTRAMUSCULAR | Status: AC
  Administered 2018-04-16: 0.5 mL via INTRAMUSCULAR

## 2018-04-16 NOTE — ED Provider Notes (Signed)
[facility] Emergency Department Provider Note  ____________________________________________  Time seen: Approximately 3:55 PM  I have reviewed the triage vital signs and the nursing notes.   HISTORY  Chief Complaint Wrist Injury    HPI Phillip Patterson is a 28 y.o. male presents to the emergency department with acute left wrist pain after patient reports that he sustained a puncture from a wire that was being used by a grinder.  Patient reported soft tissue swelling initially that has since improved since waiting in the emergency department.  Patient denies numbness or tingling in the left wrist and left forearm.  His tetanus status is out of date.  No prior left wrist injuries.  No alleviating measures have been attempted.   Past Medical History:  Diagnosis Date  . Collapse of both lungs   . Status post dissection of neck     Patient Active Problem List   Diagnosis Date Noted  . Neck pain 02/14/2018  . Dissection of vertebral artery (HCC) 02/14/2018    Past Surgical History:  Procedure Laterality Date  . CHEST TUBE INSERTION    . URETHRA SURGERY    . URETHRAL FISTULA REPAIR      Prior to Admission medications   Medication Sig Start Date End Date Taking? Authorizing Provider  amoxicillin-clavulanate (AUGMENTIN) 875-125 MG tablet Take 1 tablet by mouth 2 (two) times daily for 7 days. 04/16/18 04/23/18  Orvil Feil, PA-C  aspirin EC 325 MG tablet Take 1 tablet (325 mg total) by mouth daily. Patient taking differently: Take 81 mg by mouth daily.  02/08/18 02/08/19  Phineas Semen, MD  clopidogrel (PLAVIX) 75 MG tablet Take 1 tablet (75 mg total) by mouth daily. Patient not taking: Reported on 03/17/2018 02/08/18 02/08/19  Phineas Semen, MD  cyclobenzaprine (FLEXERIL) 5 MG tablet Take 1 tablet by mouth 3 (three) times daily as needed. 01/30/18   [provider]  gabapentin (NEURONTIN) 300 MG capsule Take 1 capsule by mouth 3 (three) times  daily. 02/06/18   [provider]  HYDROcodone-acetaminophen (NORCO/VICODIN) 5-325 MG tablet Take 1 tablet by mouth every 6 (six) hours as needed. 01/30/18   [provider]  tiZANidine (ZANAFLEX) 4 MG tablet Take 1 tablet by mouth 3 (three) times daily as needed. 01/24/18   [provider]  traMADol (ULTRAM) 50 MG tablet Take 1 tablet by mouth 2 (two) times daily as needed. 02/06/18   [provider]    Allergies Patient has no known allergies.  Family History  Problem Relation Age of Onset  . Diabetes Maternal Grandmother   . Hypertension Maternal Grandfather     Social History Social History   Tobacco Use  . Smoking status: Never Smoker  . Smokeless tobacco: Never Used  Substance Use Topics  . Alcohol use: Yes  . Drug use: No     Review of Systems  Constitutional: No fever/chills Eyes: No visual changes. No discharge ENT: No upper respiratory complaints. Cardiovascular: no chest pain. Respiratory: no cough. No SOB. Gastrointestinal: No abdominal pain.  No nausea, no vomiting.  No diarrhea.  No constipation. Genitourinary: Negative for dysuria. No hematuria Musculoskeletal: Patient has left wrist pain.  Skin: Patient has puncture at left wrist.  Neurological: Negative for headaches, focal weakness or numbness.   ____________________________________________   PHYSICAL EXAM:  VITAL SIGNS: ED Triage Vitals  Enc Vitals Group     BP 04/16/18 1304 129/70     Pulse Rate 04/16/18 1304 73  Resp 04/16/18 1304 18     Temp 04/16/18 1304 98.2 F (36.8 C)     Temp Source 04/16/18 1304 Oral     SpO2 04/16/18 1304 98 %     Weight 04/16/18 1305 145 lb (65.8 kg)     Height 04/16/18 1305 6\' 1"  (1.854 m)     Head Circumference --      Peak Flow --      Pain Score 04/16/18 1305 0     Pain Loc --      Pain Edu? --      Excl. in GC? --      Constitutional: Alert and oriented. Well appearing and in no acute distress. Eyes:  Conjunctivae are normal. PERRL. EOMI. Head: Atraumatic. Cardiovascular: Normal rate, regular rhythm. Normal S1 and S2.  Good peripheral circulation. Respiratory: Normal respiratory effort without tachypnea or retractions. Lungs CTAB. Good air entry to the bases with no decreased or absent breath sounds. Musculoskeletal: Full range of motion to all extremities. No gross deformities appreciated.  Compartments along left forearm are soft and warm with a palpable radial pulse, left. Neurologic:  Normal speech and language. No gross focal neurologic deficits are appreciated.  Skin: Patient has small puncture wound that has already scabbed over along the volar aspect of the left wrist. Psychiatric: Mood and affect are normal. Speech and behavior are normal. Patient exhibits appropriate insight and judgement.   ____________________________________________   LABS (all labs ordered are listed, but only abnormal results are displayed)  Labs Reviewed - No data to display ____________________________________________  EKG   ____________________________________________  RADIOLOGY I personally viewed and evaluated these images as part of my medical decision making, as well as reviewing the written report by the radiologist.  Dg Wrist Complete Left  Result Date: 04/16/2018 CLINICAL DATA:  Patient states that he was working with wire grinder and "wire went in his arm." Bruising and puncture wound to left anterior wrist. EXAM: LEFT WRIST - COMPLETE 3+ VIEW COMPARISON:  None. FINDINGS: No fracture or bone lesion. Joints are normally spaced and aligned.  No arthropathic changes. Soft tissues are unremarkable.  No radiopaque foreign body. IMPRESSION: Negative. Electronically Signed   By: Amie Portlandavid  Ormond M.D.   On: 04/16/2018 14:54    ____________________________________________    PROCEDURES  Procedure(s) performed:    Procedures    Medications - No data to  display   ____________________________________________   INITIAL IMPRESSION / ASSESSMENT AND PLAN / ED COURSE  Pertinent labs & imaging results that were available during my care of the patient were reviewed by me and considered in my medical decision making (see chart for details).  Review of the [place] CSRS was performed in accordance of the [facility] prior to dispensing any controlled drugs.      Assessment and Plan: Left forearm puncture:  Patient presents to the emergency department with a puncture wound along the volar aspect of the left wrist sustained with a grinder at work.  X-ray examination reveals no retained foreign bodies or fractures.  Patient's tetanus status was updated in the emergency department and he was discharged with Augmentin.  Patient was advised to follow-up with primary care as needed.  All patient questions were answered.    ____________________________________________  FINAL CLINICAL IMPRESSION(S) / ED DIAGNOSES  Final diagnoses:  Puncture      NEW MEDICATIONS STARTED DURING THIS VISIT:  ED Discharge Orders         Ordered    amoxicillin-clavulanate (AUGMENTIN) 875-125 MG tablet  2 times daily     04/16/18 1551              This chart was dictated using voice recognition software/Dragon. Despite best efforts to proofread, errors can occur which can change the meaning. Any change was purely unintentional.    Orvil FeilWoods, Millard Bautch M, PA-C 04/16/18 1603    Jene EveryKinner, Robert, MD 04/17/18 1054

## 2018-04-16 NOTE — ED Triage Notes (Signed)
Pt arrives with L wrist injury. 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 States blood thinner use. Bleeding stopped at this time. A&O, ambulatory.   [employer] is who pt works for. Filling WC.

## 2018-05-24 ENCOUNTER — Other Ambulatory Visit: Payer: Self-pay

## 2018-05-24 ENCOUNTER — Encounter: Payer: Self-pay | Admitting: Emergency Medicine

## 2018-05-24 ENCOUNTER — Emergency Department: Payer: BLUE CROSS/BLUE SHIELD

## 2018-05-24 ENCOUNTER — Emergency Department
Admission: EM | Admit: 2018-05-24 | Discharge: 2018-05-24 | Disposition: A | Discharge status: Home / Self Care | Payer: BLUE CROSS/BLUE SHIELD | Attending: Emergency Medicine | Admitting: Emergency Medicine

## 2018-05-24 DIAGNOSIS — Y999 Unspecified external cause status: Secondary | ICD-10-CM | POA: Insufficient documentation

## 2018-05-24 DIAGNOSIS — Z79899 Other long term (current) drug therapy: Secondary | ICD-10-CM | POA: Insufficient documentation

## 2018-05-24 DIAGNOSIS — Y939 Activity, unspecified: Secondary | ICD-10-CM | POA: Diagnosis not present

## 2018-05-24 DIAGNOSIS — S161XXA Strain of muscle, fascia and tendon at neck level, initial encounter: Secondary | ICD-10-CM | POA: Diagnosis not present

## 2018-05-24 DIAGNOSIS — Y929 Unspecified place or not applicable: Secondary | ICD-10-CM | POA: Insufficient documentation

## 2018-05-24 DIAGNOSIS — S199XXA Unspecified injury of neck, initial encounter: Secondary | ICD-10-CM | POA: Diagnosis present

## 2018-05-24 LAB — CBC WITH DIFFERENTIAL/PLATELET
ABS IMMATURE GRANULOCYTES: 0.01 10*3/uL (ref 0.00–0.07)
BASOS ABS: 0 10*3/uL (ref 0.0–0.1)
BASOS PCT: 0 %
Eosinophils Absolute: 0.1 10*3/uL (ref 0.0–0.5)
Eosinophils Relative: 2 %
HCT: 42.2 % (ref 39.0–52.0)
Hemoglobin: 14.8 g/dL (ref 13.0–17.0)
IMMATURE GRANULOCYTES: 0 %
Lymphocytes Relative: 25 %
Lymphs Abs: 1.3 10*3/uL (ref 0.7–4.0)
MCH: 30.3 pg (ref 26.0–34.0)
MCHC: 35.1 g/dL (ref 30.0–36.0)
MCV: 86.5 fL (ref 80.0–100.0)
Monocytes Absolute: 0.5 10*3/uL (ref 0.1–1.0)
Monocytes Relative: 9 %
NEUTROS ABS: 3.3 10*3/uL (ref 1.7–7.7)
NEUTROS PCT: 64 %
PLATELETS: 214 10*3/uL (ref 150–400)
RBC: 4.88 MIL/uL (ref 4.22–5.81)
RDW: 12.3 % (ref 11.5–15.5)
WBC: 5.2 10*3/uL (ref 4.0–10.5)
nRBC: 0 % (ref 0.0–0.2)

## 2018-05-24 LAB — COMPREHENSIVE METABOLIC PANEL
ALT: 29 U/L (ref 0–44)
AST: 23 U/L (ref 15–41)
Albumin: 4.9 g/dL (ref 3.5–5.0)
Alkaline Phosphatase: 52 U/L (ref 38–126)
Anion gap: 7 (ref 5–15)
BUN: 12 mg/dL (ref 6–20)
CHLORIDE: 104 mmol/L (ref 98–111)
CO2: 30 mmol/L (ref 22–32)
CREATININE: 0.85 mg/dL (ref 0.61–1.24)
Calcium: 9.7 mg/dL (ref 8.9–10.3)
GFR calc non Af Amer: >60 mL/min (ref >60)
Glucose, Bld: 101 mg/dL — ABNORMAL HIGH (ref 70–99)
Potassium: 3.7 mmol/L (ref 3.5–5.1)
SODIUM: 141 mmol/L (ref 135–145)
Total Bilirubin: 0.8 mg/dL (ref 0.3–1.2)
Total Protein: 7.4 g/dL (ref 6.5–8.1)

## 2018-05-24 MED ORDER — IOHEXOL 350 MG/ML SOLN
75.0000 mL | Freq: Once | INTRAVENOUS | Status: AC | PRN
  Administered 2018-05-24: 75 mL via INTRAVENOUS
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
## 2018-05-24 NOTE — Discharge Instructions (Addendum)
Close some attention to all of your different scratches and scrapes and the bite marks, if there are signs of infection, return to the emergency room.  This would include redness, swelling, or other concerns
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
## 2018-09-18 ENCOUNTER — Ambulatory Visit (INDEPENDENT_AMBULATORY_CARE_PROVIDER_SITE_OTHER): Payer: BLUE CROSS/BLUE SHIELD | Admitting: Nurse Practitioner

## 2018-09-18 ENCOUNTER — Encounter (INDEPENDENT_AMBULATORY_CARE_PROVIDER_SITE_OTHER): Payer: BLUE CROSS/BLUE SHIELD

## 2019-07-30 ENCOUNTER — Other Ambulatory Visit: Payer: Self-pay

## 2019-07-30 DIAGNOSIS — R251 Tremor, unspecified: Secondary | ICD-10-CM | POA: Diagnosis not present

## 2019-07-30 DIAGNOSIS — R202 Paresthesia of skin: Secondary | ICD-10-CM | POA: Diagnosis not present

## 2019-07-30 DIAGNOSIS — Z79899 Other long term (current) drug therapy: Secondary | ICD-10-CM | POA: Diagnosis not present

## 2019-07-30 DIAGNOSIS — M5481 Occipital neuralgia: Secondary | ICD-10-CM | POA: Insufficient documentation

## 2019-07-30 DIAGNOSIS — R200 Anesthesia of skin: Secondary | ICD-10-CM | POA: Diagnosis not present

## 2019-07-30 DIAGNOSIS — R42 Dizziness and giddiness: Secondary | ICD-10-CM | POA: Diagnosis present

## 2019-07-30 DIAGNOSIS — G43801 Other migraine, not intractable, with status migrainosus: Secondary | ICD-10-CM | POA: Diagnosis not present

## 2019-07-30 LAB — BASIC METABOLIC PANEL
Anion gap: 7 (ref 5–15)
BUN: 10 mg/dL (ref 6–20)
CO2: 31 mmol/L (ref 22–32)
Calcium: 9.5 mg/dL (ref 8.9–10.3)
Chloride: 104 mmol/L (ref 98–111)
Creatinine, Ser: 1.14 mg/dL (ref 0.61–1.24)
GFR calc Af Amer: >60 mL/min (ref >60)
GFR calc non Af Amer: >60 mL/min (ref >60)
Glucose, Bld: 104 mg/dL — ABNORMAL HIGH (ref 70–99)
Potassium: 4 mmol/L (ref 3.5–5.1)
Sodium: 142 mmol/L (ref 135–145)

## 2019-07-30 LAB — CBC
HCT: 41.9 % (ref 39.0–52.0)
Hemoglobin: 14.5 g/dL (ref 13.0–17.0)
MCH: 30.1 pg (ref 26.0–34.0)
MCHC: 34.6 g/dL (ref 30.0–36.0)
MCV: 86.9 fL (ref 80.0–100.0)
Platelets: 212 10*3/uL (ref 150–400)
RBC: 4.82 MIL/uL (ref 4.22–5.81)
RDW: 12.3 % (ref 11.5–15.5)
WBC: 4.4 10*3/uL (ref 4.0–10.5)
nRBC: 0 % (ref 0.0–0.2)

## 2019-07-30 LAB — TROPONIN I (HIGH SENSITIVITY): Troponin I (High Sensitivity): <2 ng/L (ref <18)

## 2019-07-30 NOTE — ED Triage Notes (Signed)
Pt arrives to ED via POV from home with c/o tremors and a near-syncopal episode about 30 minutes PTA. Pt denies any actual LOC; no fall or injury. Pt also reports feeling "cold and hot" and sensation of pins and needles all over, "like my skin is burning on my chest, in my hands, my legs, even in my teeth". Pt denies CP or SHOB, no N/V/D or fever. Pt is A&O, in NAD; RR even, regular, and unlabored.

## 2019-07-31 ENCOUNTER — Emergency Department: Payer: BC Managed Care – PPO

## 2019-07-31 ENCOUNTER — Encounter: Payer: Self-pay | Admitting: Radiology

## 2019-07-31 ENCOUNTER — Emergency Department
Admission: EM | Admit: 2019-07-31 | Discharge: 2019-07-31 | Disposition: A | Discharge status: Home / Self Care | Payer: BC Managed Care – PPO | Attending: Emergency Medicine | Admitting: Emergency Medicine

## 2019-07-31 DIAGNOSIS — G43101 Migraine with aura, not intractable, with status migrainosus: Secondary | ICD-10-CM

## 2019-07-31 LAB — URINALYSIS, COMPLETE (UACMP) WITH MICROSCOPIC
Bacteria, UA: NONE SEEN
Bilirubin Urine: NEGATIVE
Glucose, UA: NEGATIVE mg/dL
Hgb urine dipstick: NEGATIVE
Ketones, ur: NEGATIVE mg/dL
Nitrite: NEGATIVE
Protein, ur: NEGATIVE mg/dL
Specific Gravity, Urine: 1.015 (ref 1.005–1.030)
pH: 8 (ref 5.0–8.0)

## 2019-07-31 LAB — TROPONIN I (HIGH SENSITIVITY): Troponin I (High Sensitivity): 4 ng/L (ref <18)

## 2019-07-31 LAB — URINE DRUG SCREEN, QUALITATIVE (ARMC ONLY)
Amphetamines, Ur Screen: NOT DETECTED
Barbiturates, Ur Screen: NOT DETECTED
Benzodiazepine, Ur Scrn: NOT DETECTED
Cannabinoid 50 Ng, Ur ~~LOC~~: NOT DETECTED
Cocaine Metabolite,Ur ~~LOC~~: NOT DETECTED
MDMA (Ecstasy)Ur Screen: NOT DETECTED
Methadone Scn, Ur: NOT DETECTED
Opiate, Ur Screen: NOT DETECTED
Phencyclidine (PCP) Ur S: NOT DETECTED
Tricyclic, Ur Screen: POSITIVE — AB

## 2019-07-31 MED ORDER — IOHEXOL 350 MG/ML SOLN
75.0000 mL | Freq: Once | INTRAVENOUS | Status: AC | PRN
  Administered 2019-07-31: 75 mL via INTRAVENOUS

## 2019-07-31 NOTE — Discharge Instructions (Signed)
Call your Neurologist today to discuss if your symptoms could be a side effect of the new medications you are on. In the meantime continue to take them as prescribed. Return to the ER for chest pain, severe headache, if you pass out, or any other symptoms concerning to you.

## 2019-07-31 NOTE — ED Provider Notes (Signed)
[facility] Emergency Department Provider Note  ____________________________________________  Time seen: Approximately 5:04 AM  I have reviewed the triage vital signs and the nursing notes.   HISTORY  Chief Complaint Near Syncope and Tremors   HPI Phillip Patterson is a 29 y.o. male with a history of traumatic vertebral artery dissection, migraine headaches, bilateral occipital neuralgia who presents for evaluation of dizziness and tremors.  Patient reports that while driving to work today he started experiencing sensation of pins-and-needles throughout his entire body.  He describes as "I feel freezing cold on the outside but burning hot on the inside."  At the same time he reports having pain in his entire head and neck that he describes as sharp pain in his eyes, his teeth, his ears, his neck and his head.  He describes associated tunnel vision and feeling lightheaded like he is going to pass out.  He reports a similar episode last weekend.  Never had anything like this before.  Patient reports history of pretty severe migraines that usually have a visual aura which is similar to what he is experiencing visually during these episodes but the associated symptoms are very different otherwise.  He denies excessive caffeine intake and reports drinking 1 glass a day.  He denies drugs.  He reports drinking occasionally/socially.  No alcohol today.  Is not a smoker.  He reports that both episodes lasted 5 to 6 hours.  At this time he feels back to baseline.  Of note, patient was started on amitriptyline and Ubrelvy for migraine headaches 3 weeks ago.  He takes amitriptyline every day and Ubrelvy as needed.  He reports taking 1 prior to these 2 episodes with no significant relief.  He has been on gabapentin for several years.  He denies chest pain or shortness of breath, nausea or vomiting, abdominal pain, fever or chills.  Past Medical History:  Diagnosis Date  . Collapse of  both lungs   . Status post dissection of neck     Patient Active Problem List   Diagnosis Date Noted  . Neck pain 02/14/2018  . Dissection of vertebral artery (HCC) 02/14/2018    Past Surgical History:  Procedure Laterality Date  . CHEST TUBE INSERTION    . URETHRA SURGERY    . URETHRAL FISTULA REPAIR      Prior to Admission medications   Medication Sig Start Date End Date Taking? Authorizing Provider  cyclobenzaprine (FLEXERIL) 5 MG tablet Take 1 tablet by mouth 3 (three) times daily as needed. 01/30/18   [provider]  gabapentin (NEURONTIN) 300 MG capsule Take 1 capsule by mouth 3 (three) times daily. 02/06/18   [provider]  HYDROcodone-acetaminophen (NORCO/VICODIN) 5-325 MG tablet Take 1 tablet by mouth every 6 (six) hours as needed. 01/30/18   [provider]  tiZANidine (ZANAFLEX) 4 MG tablet Take 1 tablet by mouth 3 (three) times daily as needed. 01/24/18   [provider]  traMADol (ULTRAM) 50 MG tablet Take 1 tablet by mouth 2 (two) times daily as needed. 02/06/18   [provider]    Allergies Patient has no known allergies.  Family History  Problem Relation Age of Onset  . Diabetes Maternal Grandmother   . Hypertension Maternal Grandfather     Social History Social History   Tobacco Use  . Smoking status: Never Smoker  . Smokeless tobacco: Never Used  Substance Use Topics  . Alcohol use: Yes  . Drug use: No  Review of Systems  Constitutional: Negative for fever. + lightheadedness Eyes: Negative for visual changes. ENT: Negative for sore throat. Neck: No neck pain  Cardiovascular: Negative for chest pain. Respiratory: Negative for shortness of breath. Gastrointestinal: Negative for abdominal pain, vomiting or diarrhea. Genitourinary: Negative for dysuria. Musculoskeletal: Negative for back pain. Skin: Negative for rash. Neurological: Negative forweakness or numbness. + head and neck pain, pins and  needles Psych: No SI or HI  ____________________________________________   PHYSICAL EXAM:  VITAL SIGNS: ED Triage Vitals  Enc Vitals Group     BP 07/30/19 2216 122/72     Pulse Rate 07/30/19 2216 (!) 104     Resp 07/30/19 2216 17     Temp 07/30/19 2216 98.1 F (36.7 C)     Temp Source 07/30/19 2216 Oral     SpO2 07/30/19 2216 99 %     Weight 07/30/19 2213 145 lb (65.8 kg)     Height 07/30/19 2213 6\' 1"  (1.854 m)     Head Circumference --      Peak Flow --      Pain Score 07/30/19 2213 6     Pain Loc --      Pain Edu? --      Excl. in [place]? --     Constitutional: Alert and oriented, looks anxious, in no apparent distress. HEENT:      Head: Normocephalic and atraumatic.         Eyes: Conjunctivae are normal. Sclera is non-icteric.       Mouth/Throat: Mucous membranes are moist.       Neck: Supple with no signs of meningismus. Cardiovascular: Regular rate and rhythm. No murmurs, gallops, or rubs. 2+ symmetrical distal pulses are present in all extremities. No JVD. Respiratory: Normal respiratory effort. Lungs are clear to auscultation bilaterally. No wheezes, crackles, or rhonchi.  Gastrointestinal: Soft, non tender, and non distended with positive bowel sounds. No rebound or guarding. Musculoskeletal: Nontender with normal range of motion in all extremities. No edema, cyanosis, or erythema of extremities. Neurologic: Normal speech and language. Face is symmetric.  Intact strength and sensation x4 Skin: Skin is warm, dry and intact. No rash noted. Psychiatric: Mood and affect are normal. Speech and behavior are normal.  ____________________________________________   LABS (all labs ordered are listed, but only abnormal results are displayed)  Labs Reviewed  BASIC METABOLIC PANEL - Abnormal; Notable for the following components:      Result Value   Glucose, Bld 104 (*)    All other components within normal limits  URINALYSIS, COMPLETE (UACMP) WITH MICROSCOPIC - Abnormal;  Notable for the following components:   Color, Urine YELLOW (*)    APPearance CLEAR (*)    Leukocytes,Ua TRACE (*)    All other components within normal limits  URINE DRUG SCREEN, QUALITATIVE ([facility] ONLY) - Abnormal; Notable for the following components:   Tricyclic, Ur Screen POSITIVE (*)    All other components within normal limits  CBC  CBG MONITORING, ED  TROPONIN I (HIGH SENSITIVITY)  TROPONIN I (HIGH SENSITIVITY)   ____________________________________________  EKG  ED ECG REPORT I, Rudene Re, the attending physician, personally viewed and interpreted this ECG.  Sinus tachycardia, rate of 112, normal intervals, normal axis, no ST elevations or depressions.  Sinus rhythm has been replaced by sinus tachycardia but no other acute changes. ____________________________________________  RADIOLOGY  I have personally reviewed the images performed during this visit and I agree with the Radiologist's read.   Interpretation by Radiologist:  CT Angio Head W or Wo Contrast  Result Date: 07/31/2019 CLINICAL DATA:  Headache, acute, normal neuro exam. Tremors and near syncopal episode. Diffuse abnormal sensations. Personal history of left vertebral artery dissection. EXAM: CT ANGIOGRAPHY HEAD AND NECK TECHNIQUE: Multidetector CT imaging of the head and neck was performed using the standard protocol during bolus administration of intravenous contrast. Multiplanar CT image reconstructions and MIPs were obtained to evaluate the vascular anatomy. Carotid stenosis measurements (when applicable) are obtained utilizing NASCET criteria, using the distal internal carotid diameter as the denominator. CONTRAST:  36mL OMNIPAQUE IOHEXOL 350 MG/ML SOLN COMPARISON:  CTA of the head and neck 05/24/2018. FINDINGS: CT HEAD FINDINGS Brain: No acute infarct, hemorrhage, or mass lesion is present. The ventricles are of normal size. No significant extraaxial fluid collection is present. No significant white  matter lesions are present. The brainstem and cerebellum are within normal limits. Vascular: No hyperdense vessel or unexpected calcification. Skull: Calvarium is intact. No focal lytic or blastic lesions are present. No significant extracranial soft tissue lesion is present. Sinuses: A polyp or mucous retention cyst is present in the right maxillary sinus. The paranasal sinuses and mastoid air cells are otherwise clear. Orbits: The globes and orbits are within normal limits. Review of the MIP images confirms the above findings CTA NECK FINDINGS Aortic arch: A 3 vessel arch configuration is present. No significant stenosis is present. No vascular calcifications are present. Right carotid system: The right common carotid artery is within normal limits. Bifurcation is normal. Cervical right ICA is normal. Left carotid system: The left common carotid artery is within normal limits. Bifurcation is unremarkable. Cervical left ICA is normal. Vertebral arteries: Previously noted left vertebral artery dissection is healed. Right vertebral artery is slightly dominant to the left. No focal vascular injury or stenosis is present in either vertebral artery in the neck. Both vertebral arteries originate from the subclavian arteries without significant stenosis. Skeleton: Vertebral body heights alignment are normal. Cervical lordosis is preserved. No focal lytic or blastic lesions are present. Other neck: Soft tissues of the neck are otherwise unremarkable. Upper chest: The lung apices are clear. No focal nodule, mass, or airspace disease is present. Review of the MIP images confirms the above findings CTA HEAD FINDINGS Anterior circulation: The internal carotid arteries are within normal limits from the high cervical segments through the ICA termini bilaterally. The A1 and M1 segments are normal. The anterior communicating artery is patent. MCA bifurcations are intact. The ACA and MCA branch vessels are within limits. Posterior  circulation: Vertebral arteries are within normal limits. PICA origins are visualized and normal. The vertebrobasilar junction is normal. Both posterior cerebral arteries originate from the basilar tip. The PCA branch vessels are within normal limits. Venous sinuses: Dural sinuses are patent. The straight sinus and deep cerebral veins are intact. Cortical veins are unremarkable. Anatomic variants: None Review of the MIP images confirms the above findings IMPRESSION: 1. Normal CTA circle-of-Willis without significant proximal stenosis, aneurysm, or branch vessel occlusion. 2. Normal CTA neck. Remote left vertebral artery dissection has completely healed. 3. Normal noncontrast CT appearance of the head. No acute or focal lesion to explain the patient's symptoms. Electronically Signed   By: Marin Roberts M.D.   On: 07/31/2019 06:36   CT Angio Neck W and/or Wo Contrast  Result Date: 07/31/2019 CLINICAL DATA:  Headache, acute, normal neuro exam. Tremors and near syncopal episode. Diffuse abnormal sensations. Personal history of left vertebral artery dissection. EXAM: CT ANGIOGRAPHY HEAD AND NECK  TECHNIQUE: Multidetector CT imaging of the head and neck was performed using the standard protocol during bolus administration of intravenous contrast. Multiplanar CT image reconstructions and MIPs were obtained to evaluate the vascular anatomy. Carotid stenosis measurements (when applicable) are obtained utilizing NASCET criteria, using the distal internal carotid diameter as the denominator. CONTRAST:  2mL OMNIPAQUE IOHEXOL 350 MG/ML SOLN COMPARISON:  CTA of the head and neck 05/24/2018. FINDINGS: CT HEAD FINDINGS Brain: No acute infarct, hemorrhage, or mass lesion is present. The ventricles are of normal size. No significant extraaxial fluid collection is present. No significant white matter lesions are present. The brainstem and cerebellum are within normal limits. Vascular: No hyperdense vessel or unexpected  calcification. Skull: Calvarium is intact. No focal lytic or blastic lesions are present. No significant extracranial soft tissue lesion is present. Sinuses: A polyp or mucous retention cyst is present in the right maxillary sinus. The paranasal sinuses and mastoid air cells are otherwise clear. Orbits: The globes and orbits are within normal limits. Review of the MIP images confirms the above findings CTA NECK FINDINGS Aortic arch: A 3 vessel arch configuration is present. No significant stenosis is present. No vascular calcifications are present. Right carotid system: The right common carotid artery is within normal limits. Bifurcation is normal. Cervical right ICA is normal. Left carotid system: The left common carotid artery is within normal limits. Bifurcation is unremarkable. Cervical left ICA is normal. Vertebral arteries: Previously noted left vertebral artery dissection is healed. Right vertebral artery is slightly dominant to the left. No focal vascular injury or stenosis is present in either vertebral artery in the neck. Both vertebral arteries originate from the subclavian arteries without significant stenosis. Skeleton: Vertebral body heights alignment are normal. Cervical lordosis is preserved. No focal lytic or blastic lesions are present. Other neck: Soft tissues of the neck are otherwise unremarkable. Upper chest: The lung apices are clear. No focal nodule, mass, or airspace disease is present. Review of the MIP images confirms the above findings CTA HEAD FINDINGS Anterior circulation: The internal carotid arteries are within normal limits from the high cervical segments through the ICA termini bilaterally. The A1 and M1 segments are normal. The anterior communicating artery is patent. MCA bifurcations are intact. The ACA and MCA branch vessels are within limits. Posterior circulation: Vertebral arteries are within normal limits. PICA origins are visualized and normal. The vertebrobasilar junction  is normal. Both posterior cerebral arteries originate from the basilar tip. The PCA branch vessels are within normal limits. Venous sinuses: Dural sinuses are patent. The straight sinus and deep cerebral veins are intact. Cortical veins are unremarkable. Anatomic variants: None Review of the MIP images confirms the above findings IMPRESSION: 1. Normal CTA circle-of-Willis without significant proximal stenosis, aneurysm, or branch vessel occlusion. 2. Normal CTA neck. Remote left vertebral artery dissection has completely healed. 3. Normal noncontrast CT appearance of the head. No acute or focal lesion to explain the patient's symptoms. Electronically Signed   By: Marin Roberts M.D.   On: 07/31/2019 06:36      ____________________________________________   PROCEDURES  Procedure(s) performed:yes .1-3 Lead EKG Interpretation Performed by: Nita Sickle, MD Authorized by: Nita Sickle, MD     Interpretation: normal     ECG rate:  100   ECG rate assessment: tachycardic     Rhythm: sinus rhythm     Ectopy: none     Conduction: normal     Critical Care performed:  None ____________________________________________   INITIAL IMPRESSION / ASSESSMENT AND  PLAN / ED COURSE   29 y.o. male with a history of traumatic vertebral artery dissection, migraine headaches, bilateral occipital neuralgia who presents for evaluation of dizziness and tremors.   Ddx complex migraine, occipital neuralgia (description of symptoms seems similar to what was decrisbed by his neurologist Dr. Sherryll BurgerShah), aneurysm, medication side effect, dysrhythmia.  Patient is neuro intact, normal vitals. EKG with no evidence of ischemia or dysrhythmias.  Labs show no electrolyte abnormalities, no leukocytosis. UDS negative, patient denies drugs or excessive caffeine intake. Recently started on amitriptyline every day and Ubrelvy as needed. No thunderclap HA ir neuro deficits [facility]. With history of prior  vertebral artery dissection, will get CTA head and neck. Patient seems back to baseline at this time. If CTs negative, plan to dc home with close f/u with his Neurology.  Old medical records have been reviewed.  Patient connected to telemetry to monitor for possible dysrhythmias.  _________________________ 6:49 AM on 07/31/2019 -----------------------------------------  Telemetry strip reviewed by me showing no signs of dysrhythmias.  CT of the head and neck did not show any acute abnormalities.  Patient remains with no further symptoms in the emergency room.  I recommended that he calls his neurologist this morning to discuss if his symptoms could be a side effect of the medications and if any changes should be done to the new medications.  Recommended return to the emergency room for thunderclap headache, syncope, or chest pain.     _____________________________________________ Please note:  Patient was evaluated in Emergency Department today for the symptoms described in the history of present illness. Patient was evaluated in the context of the global COVID-19 pandemic, which necessitated consideration that the patient might be at risk for infection with the SARS-CoV-2 virus that causes COVID-19. Institutional protocols and algorithms that pertain to the evaluation of patients at risk for COVID-19 are in a state of rapid change based on information released by regulatory bodies including the CDC and federal and state organizations. These policies and algorithms were followed during the patient's care in the ED.  Some ED evaluations and interventions may be delayed as a result of limited staffing during the pandemic.   [place] Controlled Substance Database was reviewed by me. ____________________________________________   FINAL CLINICAL IMPRESSION(S) / ED DIAGNOSES   Final diagnoses:  Complicated migraine with status migrainosus      NEW MEDICATIONS STARTED DURING THIS VISIT:  ED Discharge  Orders    None       Note:  This document was prepared using Dragon voice recognition software and may include unintentional dictation errors.    Don PerkingVeronese, [place], MD 07/31/19 [id number]

## 2019-07-31 NOTE — ED Notes (Signed)
Patient ambulatory to restroom without difficulty. 

## 2019-08-01 ENCOUNTER — Other Ambulatory Visit: Payer: Self-pay | Admitting: Neurology

## 2019-08-01 ENCOUNTER — Other Ambulatory Visit (HOSPITAL_COMMUNITY): Payer: Self-pay | Admitting: Neurology

## 2019-08-01 DIAGNOSIS — R4789 Other speech disturbances: Secondary | ICD-10-CM

## 2019-08-01 DIAGNOSIS — Z9889 Other specified postprocedural states: Secondary | ICD-10-CM

## 2019-08-12 ENCOUNTER — Ambulatory Visit
Admission: RE | Admit: 2019-08-12 | Discharge: 2019-08-12 | Disposition: A | Discharge status: Home / Self Care | Payer: BC Managed Care – PPO | Source: Ambulatory Visit | Attending: Neurology | Admitting: Neurology

## 2019-08-12 ENCOUNTER — Other Ambulatory Visit: Payer: Self-pay

## 2019-08-12 ENCOUNTER — Ambulatory Visit: Payer: BC Managed Care – PPO

## 2019-08-12 DIAGNOSIS — Z9889 Other specified postprocedural states: Secondary | ICD-10-CM

## 2019-08-12 DIAGNOSIS — R4789 Other speech disturbances: Secondary | ICD-10-CM | POA: Diagnosis not present

## 2019-08-12 MED ORDER — GADOBUTROL 1 MMOL/ML IV SOLN
6.0000 mL | Freq: Once | INTRAVENOUS | Status: AC | PRN
  Administered 2019-08-12: 6 mL via INTRAVENOUS

## 2019-10-14 ENCOUNTER — Encounter: Payer: Self-pay | Admitting: Emergency Medicine

## 2019-10-14 ENCOUNTER — Ambulatory Visit
Admission: EM | Admit: 2019-10-14 | Discharge: 2019-10-14 | Disposition: A | Discharge status: Home / Self Care | Payer: Managed Care, Other (non HMO) | Attending: Family Medicine | Admitting: Family Medicine

## 2019-10-14 ENCOUNTER — Other Ambulatory Visit: Payer: Self-pay

## 2019-10-14 DIAGNOSIS — K047 Periapical abscess without sinus: Secondary | ICD-10-CM

## 2019-10-14 DIAGNOSIS — R221 Localized swelling, mass and lump, neck: Secondary | ICD-10-CM | POA: Diagnosis not present

## 2019-10-14 DIAGNOSIS — R5082 Postprocedural fever: Secondary | ICD-10-CM | POA: Diagnosis not present

## 2019-10-14 NOTE — Discharge Instructions (Signed)
Recommend patient go to Emergency Department for further evaluation and management °

## 2019-10-14 NOTE — ED Triage Notes (Signed)
Patient states that he has 4 wisdom teeth removed on Friday.  Patient fever and increased in his pain inside his mouth.  Patient also reports HAs.

## 2019-10-14 NOTE — ED Provider Notes (Signed)
[facility]    CSN: [id number] Arrival date & time: 10/14/19  1044      History   Chief Complaint Chief Complaint  Patient presents with  . Dental Pain    HPI Phillip Patterson is a 29 y.o. male.   29 yo male with a c/o fever, jaw/neck pain and swelling, as well as headaches since yesterday. States 3 days ago he had 4 wisdom teeth pulled out. Denies any trouble breathing, however complains of pain with swallowing. Has noticed more jaw and neck swelling on the left side.    Dental Pain   Past Medical History:  Diagnosis Date  . Collapse of both lungs   . Status post dissection of neck     Patient Active Problem List   Diagnosis Date Noted  . Neck pain 02/14/2018  . Dissection of vertebral artery (HCC) 02/14/2018    Past Surgical History:  Procedure Laterality Date  . CHEST TUBE INSERTION    . URETHRA SURGERY    . URETHRAL FISTULA REPAIR         Home Medications    Prior to Admission medications   Medication Sig Start Date End Date Taking? Authorizing Provider  chlorhexidine (PERIDEX) 0.12 % solution 15 mLs 2 (two) times daily. 10/12/19  Yes [provider]  gabapentin (NEURONTIN) 300 MG capsule Take 1 capsule by mouth 3 (three) times daily. 02/06/18  Yes [provider]  HYDROcodone-acetaminophen (NORCO/VICODIN) 5-325 MG tablet Take 1 tablet by mouth every 6 (six) hours as needed. 01/30/18  Yes [provider]  ibuprofen (ADVIL) 400 MG tablet Take 400 mg by mouth 3 (three) times daily as needed. 10/12/19  Yes [provider]  nortriptyline (PAMELOR) 25 MG capsule  08/01/19  Yes [provider]  UBRELVY 100 MG TABS Take 1 tablet by mouth daily as needed. 09/13/19  Yes [provider]  Vitamin D, Ergocalciferol, (DRISDOL) 1.25 MG (50000 UNIT) CAPS capsule Take 50,000 Units by mouth once a week. 08/06/19  Yes [provider]  cyclobenzaprine (FLEXERIL) 5 MG tablet Take 1 tablet by mouth 3 (three) times  daily as needed. 01/30/18   [provider]  tiZANidine (ZANAFLEX) 4 MG tablet Take 1 tablet by mouth 3 (three) times daily as needed. 01/24/18   [provider]  traMADol (ULTRAM) 50 MG tablet Take 1 tablet by mouth 2 (two) times daily as needed. 02/06/18   [provider]    Family History Family History  Problem Relation Age of Onset  . Diabetes Maternal Grandmother   . Hypertension Maternal Grandfather     Social History Social History   Tobacco Use  . Smoking status: Never Smoker  . Smokeless tobacco: Never Used  Vaping Use  . Vaping Use: Never used  Substance Use Topics  . Alcohol use: Yes  . Drug use: No     Allergies   Patient has no known allergies.   Review of Systems Review of Systems   Physical Exam Triage Vital Signs ED Triage Vitals  Enc Vitals Group     BP 10/14/19 1100 128/73     Pulse Rate 10/14/19 1100 (!) 125     Resp 10/14/19 1100 16     Temp 10/14/19 1100 100.3 F (37.9 C)     Temp Source 10/14/19 1100 Oral     SpO2 10/14/19 1100 97 %     Weight 10/14/19 1056 145 lb (65.8 kg)     Height 10/14/19 1056 6\' 1"  (1.854  m)     Head Circumference --      Peak Flow --      Pain Score 10/14/19 1056 10     Pain Loc --      Pain Edu? --      Excl. in [place]? --    No data found.  Updated Vital Signs BP 128/73 (BP Location: Left Arm)   Pulse (!) 125   Temp 100.3 F (37.9 C) (Oral)   Resp 16   Ht 6\' 1"  (1.854 m)   Wt 65.8 kg   SpO2 97%   BMI 19.13 kg/m   Visual Acuity Right Eye Distance:   Left Eye Distance:   Bilateral Distance:    Right Eye Near:   Left Eye Near:    Bilateral Near:     Physical Exam Vitals and nursing note reviewed.  Constitutional:      General: He is not in acute distress.    Appearance: He is not toxic-appearing or diaphoretic.  HENT:     Mouth/Throat:     Pharynx: Pharyngeal swelling and posterior oropharyngeal erythema present.     Comments: Left Posterior mouth and pharynx with  edema, erythema, bleeding noted; patent airway Cardiovascular:     Rate and Rhythm: Tachycardia present.  Pulmonary:     Effort: Pulmonary effort is normal. No respiratory distress.     Breath sounds: No stridor.  Musculoskeletal:     Cervical back: Tenderness present. Pain with movement present.  Lymphadenopathy:     Cervical: Cervical adenopathy present.  Neurological:     Mental Status: He is alert.      UC Treatments / Results  Labs (all labs ordered are listed, but only abnormal results are displayed) Labs Reviewed - No data to display  EKG   Radiology No results found.  Procedures Procedures (including critical care time)  Medications Ordered in UC Medications - No data to display  Initial Impression / Assessment and Plan / UC Course  I have reviewed the triage vital signs and the nursing notes.  Pertinent labs & imaging results that were available during my care of the patient were reviewed by me and considered in my medical decision making (see chart for details).     Final Clinical Impressions(s) / UC Diagnoses   Final diagnoses:  Dental abscess  Localized swelling, mass and lump, neck  Postprocedural fever     Discharge Instructions     Recommend patient go to Emergency Department for further evaluation and management    ED Prescriptions    None      1. diagnosis reviewed with patient and parent; recommend patient go to Emergency Department for further evaluation and management. Patient in stable condition, verbalizes understanding and will proceed by private vehicle with mother driving.    PDMP not reviewed this encounter.   , MD 10/14/19 1422

## 2019-11-30 IMAGING — MR MR CERVICAL SPINE W/O CM
5 series · 34 of 48 positions shown · non-contrast
Comparison: None.

CLINICAL DATA: Neck pain for 3 weeks.

EXAM:
MRI CERVICAL SPINE WITHOUT CONTRAST
TECHNIQUE: Multiplanar, multisequence MR imaging of the cervical spine was
performed. No intravenous contrast was administered.

[Series 2: T2 · sagittal · 3.0mm · 0.56mm/px · 6 of 13 slices shown (1 of 2)]
[im 1/13]
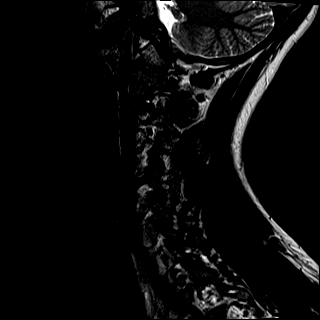
[im 3/13]
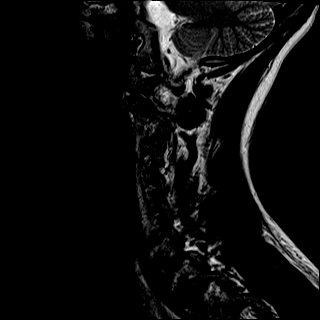
[im 5/13]
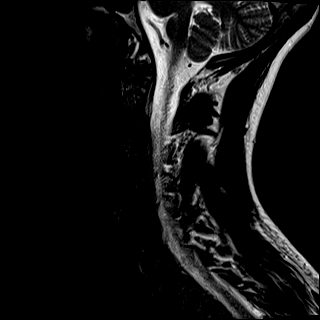
[im 8/13]
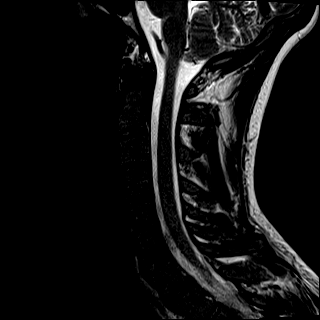
[im 10/13]
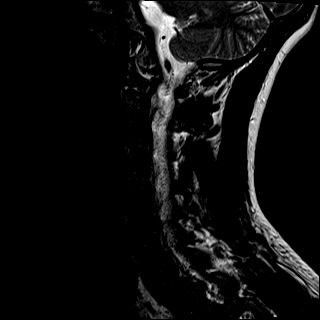
[im 13/13]
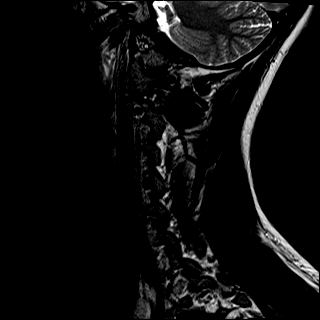

[Series 3: T1 · sagittal · 3.0mm · 0.70mm/px · 6 of 13 slices shown]
[im 1/13]
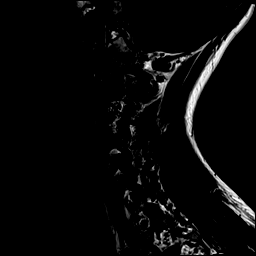
[im 3/13]
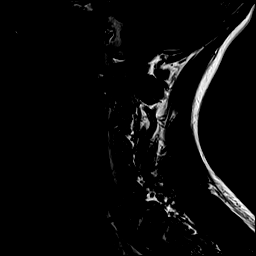
[im 5/13]
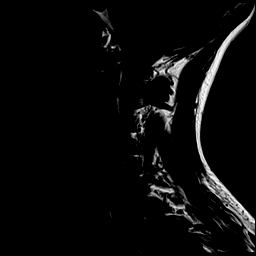
[im 8/13]
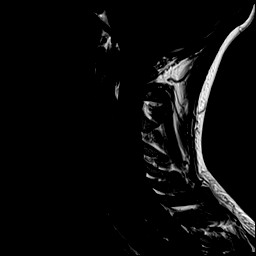
[im 10/13]
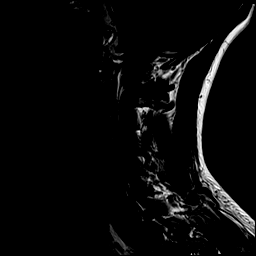
[im 13/13]
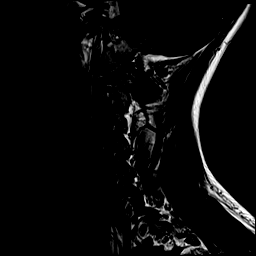

[Series 4: STIR · sagittal · 3.0mm · 0.35mm/px · 6 of 13 slices shown]
[im 1/13]
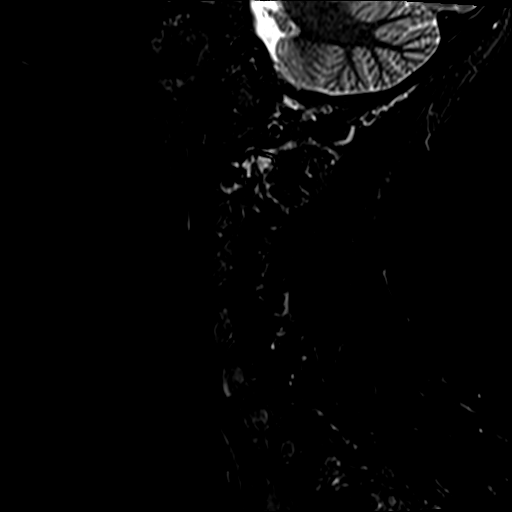
[im 3/13]
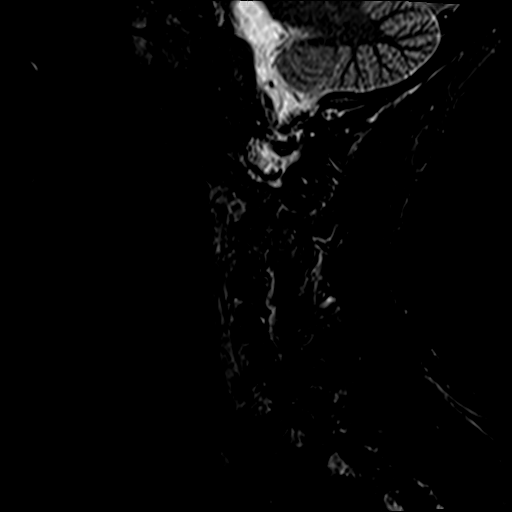
[im 5/13]
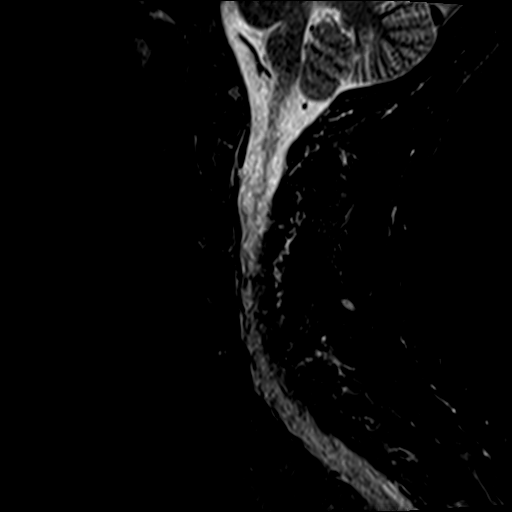
[im 8/13]
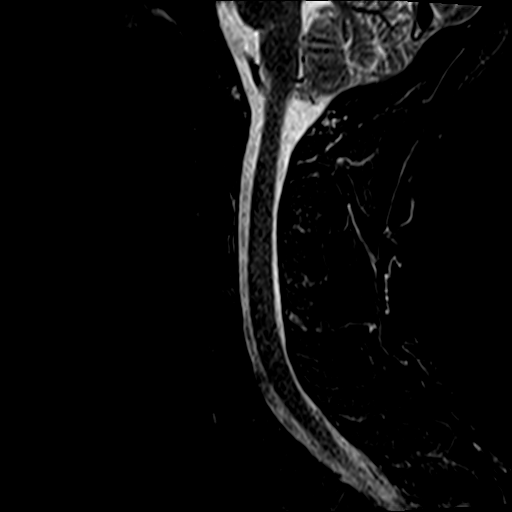
[im 10/13]
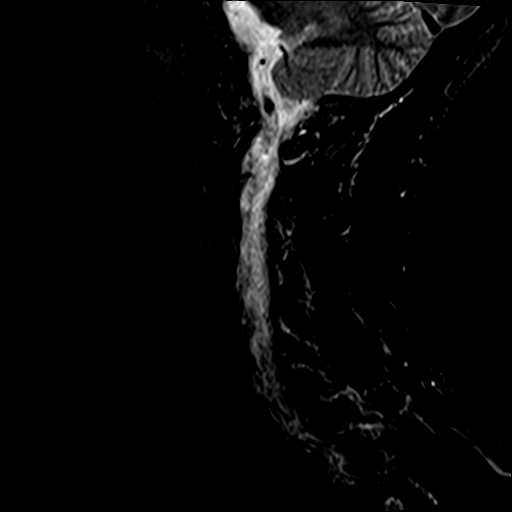
[im 13/13]
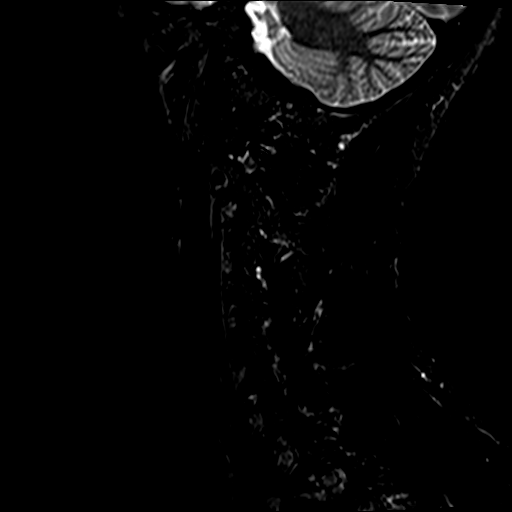

[Series 5: T2 · axial · 3.0mm · 0.62mm/px · z∈[-109,-3]mm · 9 of 30 slices shown (2 of 2)]
[im 1/30]
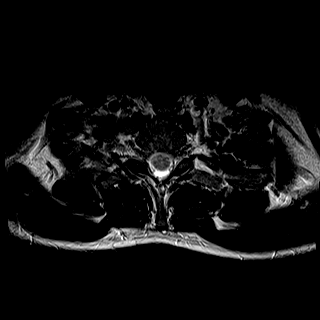
[im 5/30]
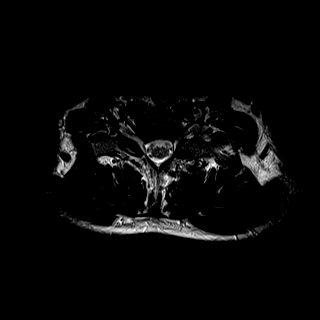
[im 9/30]
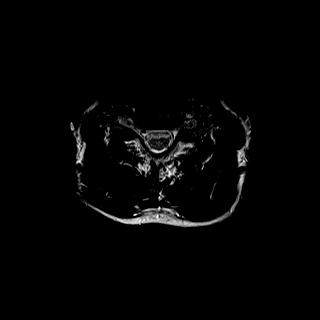
[im 13/30]
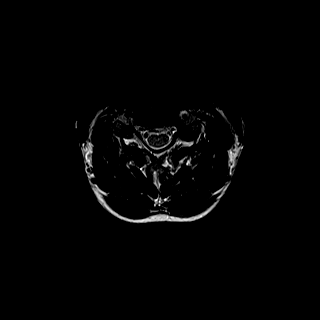
[im 15/30]
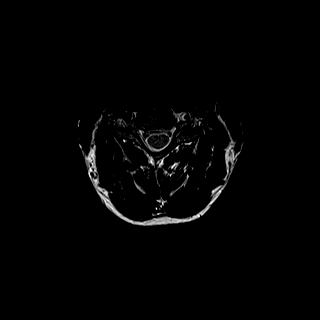
[im 17/30]
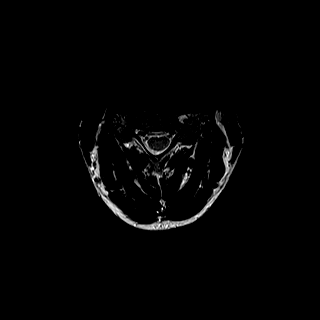
[im 21/30]
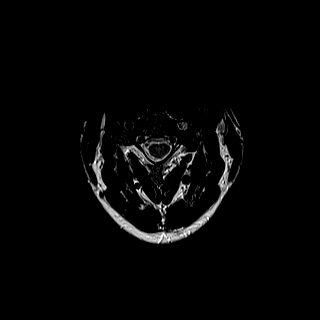
[im 25/30]
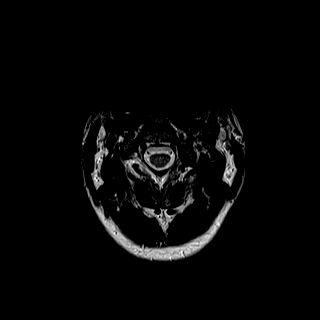
[im 30/30]
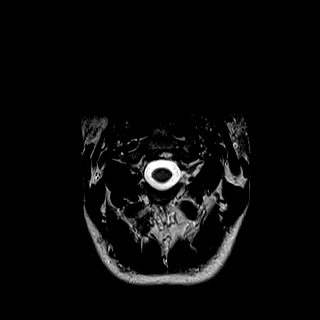

[Series 6: mpgr ax · axial · 3.0mm · 0.35mm/px · z∈[-98,-10]mm · 7 of 30 slices shown]
[im 1/30]
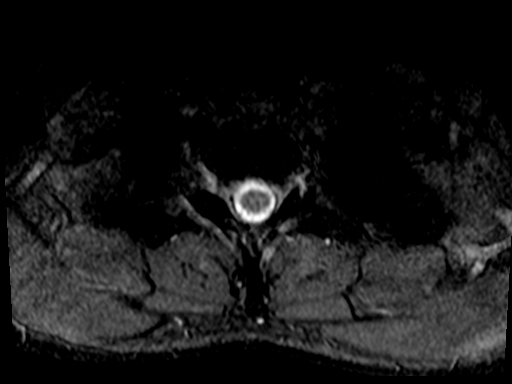
[im 5/30]
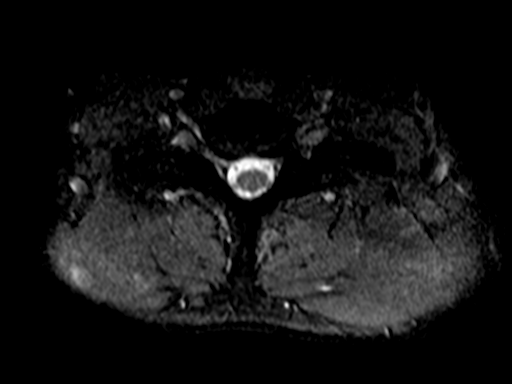
[im 9/30]
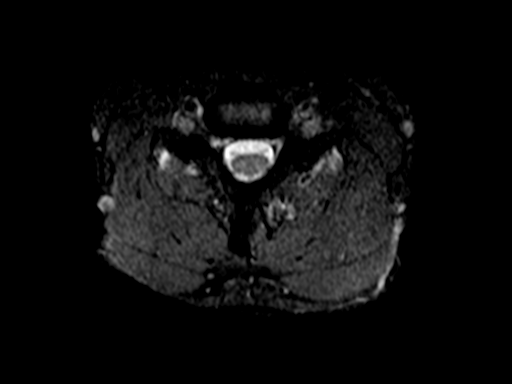
[im 13/30]
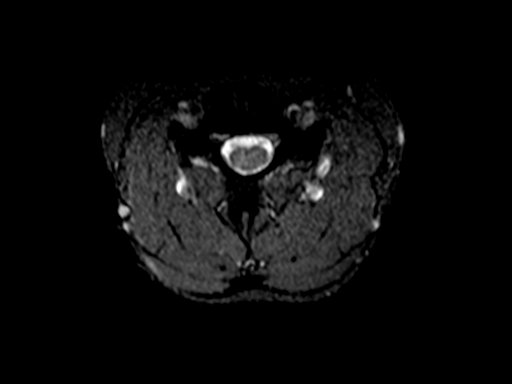
[im 17/30]
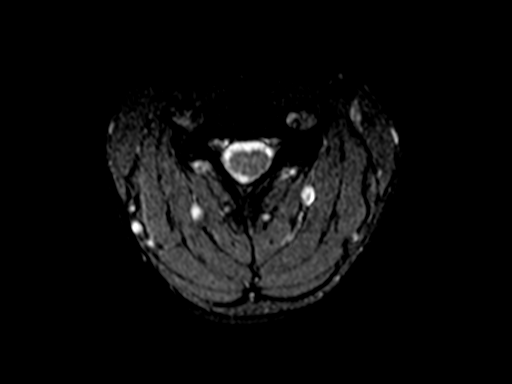
[im 21/30]
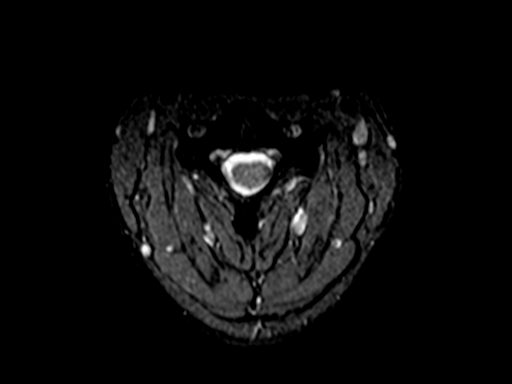
[im 25/30]
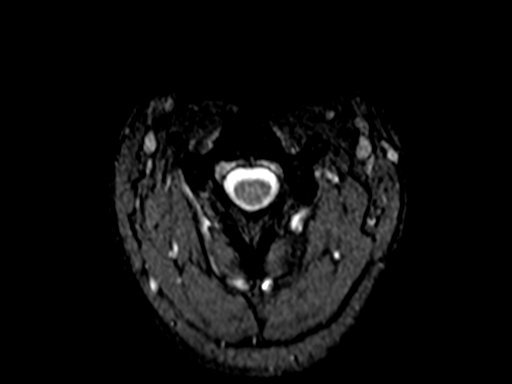

[34 of 48 positions shown; findings below may reference images not displayed]

FINDINGS: Alignment: Normal

Vertebrae: No fracture, evidence of discitis, or bone lesion.

Cord: Normal signal and morphology.

Posterior Fossa, vertebral arteries, paraspinal tissues: There is
attenuated flow void within the left vertebral artery with visible
intramural hematoma throughout the V1 and V2 segments. No evidence
of posterior circulation infarct on sagittal images. The left
vertebral artery is patent to the basilar.

Disc levels:

No degenerative changes or impingement

These results were called by telephone at the time of interpretation
on 02/08/2018 at [DATE] to Dr. Blain , who verbally acknowledged
these results.
IMPRESSION: 1. Extensive dissection of the left vertebral artery.
2. Normal cervical spine.

## 2019-12-10 ENCOUNTER — Other Ambulatory Visit: Payer: Self-pay | Admitting: Neurology

## 2019-12-10 DIAGNOSIS — M545 Low back pain, unspecified: Secondary | ICD-10-CM

## 2019-12-12 ENCOUNTER — Other Ambulatory Visit: Payer: Self-pay | Admitting: Neurology

## 2019-12-12 DIAGNOSIS — Z0189 Encounter for other specified special examinations: Secondary | ICD-10-CM

## 2019-12-12 DIAGNOSIS — Z1389 Encounter for screening for other disorder: Secondary | ICD-10-CM

## 2019-12-14 ENCOUNTER — Ambulatory Visit
Admission: RE | Admit: 2019-12-14 | Discharge: 2019-12-14 | Disposition: A | Discharge status: Home / Self Care | Payer: Managed Care, Other (non HMO) | Source: Ambulatory Visit | Attending: Neurology | Admitting: Neurology

## 2019-12-14 ENCOUNTER — Other Ambulatory Visit: Payer: Self-pay

## 2019-12-14 ENCOUNTER — Ambulatory Visit: Admission: RE | Admit: 2019-12-14 | Payer: Managed Care, Other (non HMO) | Source: Ambulatory Visit

## 2019-12-14 DIAGNOSIS — M545 Low back pain, unspecified: Secondary | ICD-10-CM

## 2021-08-16 ENCOUNTER — Emergency Department
Admission: EM | Admit: 2021-08-16 | Discharge: 2021-08-16 | Discharge status: Against Medical Advice | Payer: Managed Care, Other (non HMO) | Attending: Emergency Medicine | Admitting: Emergency Medicine

## 2021-08-16 DIAGNOSIS — M791 Myalgia, unspecified site: Secondary | ICD-10-CM | POA: Insufficient documentation

## 2021-08-16 DIAGNOSIS — Z5321 Procedure and treatment not carried out due to patient leaving prior to being seen by health care provider: Secondary | ICD-10-CM | POA: Insufficient documentation

## 2021-08-16 LAB — CBC
HCT: 44.4 % (ref 39.0–52.0)
Hemoglobin: 14.9 g/dL (ref 13.0–17.0)
MCH: 29.4 pg (ref 26.0–34.0)
MCHC: 33.6 g/dL (ref 30.0–36.0)
MCV: 87.6 fL (ref 80.0–100.0)
Platelets: 256 10*3/uL (ref 150–400)
RBC: 5.07 MIL/uL (ref 4.22–5.81)
RDW: 12.1 % (ref 11.5–15.5)
WBC: 5.8 10*3/uL (ref 4.0–10.5)
nRBC: 0 % (ref 0.0–0.2)

## 2021-08-16 LAB — BASIC METABOLIC PANEL
Anion gap: 8 (ref 5–15)
BUN: 9 mg/dL (ref 6–20)
CO2: 29 mmol/L (ref 22–32)
Calcium: 9.5 mg/dL (ref 8.9–10.3)
Chloride: 101 mmol/L (ref 98–111)
Creatinine, Ser: 1 mg/dL (ref 0.61–1.24)
GFR, Estimated: >60 mL/min (ref >60)
Glucose, Bld: 78 mg/dL (ref 70–99)
Potassium: 3.8 mmol/L (ref 3.5–5.1)
Sodium: 138 mmol/L (ref 135–145)

## 2021-08-16 NOTE — ED Triage Notes (Signed)
Pt comes pov with right side of body feeling like it's on fire. Has neuro appointment but since 2am sensation has been worse. 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 Is already on gabapentin and states it's not helping today.  ?

## 2021-08-16 NOTE — ED Notes (Signed)
See previous note. Pt called to treatment room but no longer wishes to be seen. ?

## 2021-08-16 NOTE — ED Triage Notes (Signed)
Pt called from the [facility] to a treatment room. Pt up to counter and states to RN that he has called his parents to come and get him. Pt reports his medication is kicking in now and he feels better. Pt states historically it would only take 20 minutes to kick in but today it took much longer so he was concerned. Pt encouraged to stay by RN and see the MD since he had an assigned room. Pt apologetic but reports he is leaving and no longer wishes to be seen.  ?

## 2023-08-12 ENCOUNTER — Emergency Department

## 2023-08-12 ENCOUNTER — Other Ambulatory Visit: Payer: Self-pay

## 2023-08-12 ENCOUNTER — Emergency Department
Admission: EM | Admit: 2023-08-12 | Discharge: 2023-08-12 | Disposition: A | Discharge status: Home / Self Care | Attending: Emergency Medicine | Admitting: Emergency Medicine

## 2023-08-12 DIAGNOSIS — M546 Pain in thoracic spine: Secondary | ICD-10-CM

## 2023-08-12 DIAGNOSIS — R748 Abnormal levels of other serum enzymes: Secondary | ICD-10-CM | POA: Insufficient documentation

## 2023-08-12 DIAGNOSIS — K8020 Calculus of gallbladder without cholecystitis without obstruction: Secondary | ICD-10-CM | POA: Insufficient documentation

## 2023-08-12 DIAGNOSIS — R1010 Upper abdominal pain, unspecified: Secondary | ICD-10-CM | POA: Diagnosis present

## 2023-08-12 DIAGNOSIS — R1013 Epigastric pain: Secondary | ICD-10-CM

## 2023-08-12 LAB — COMPREHENSIVE METABOLIC PANEL WITH GFR
ALT: 32 U/L (ref 0–44)
AST: 21 U/L (ref 15–41)
Albumin: 4.4 g/dL (ref 3.5–5.0)
Alkaline Phosphatase: 46 U/L (ref 38–126)
Anion gap: 8 (ref 5–15)
BUN: 17 mg/dL (ref 6–20)
CO2: 27 mmol/L (ref 22–32)
Calcium: 9.3 mg/dL (ref 8.9–10.3)
Chloride: 101 mmol/L (ref 98–111)
Creatinine, Ser: 0.8 mg/dL (ref 0.61–1.24)
GFR, Estimated: >60 mL/min (ref >60)
Glucose, Bld: 97 mg/dL (ref 70–99)
Potassium: 3.6 mmol/L (ref 3.5–5.1)
Sodium: 136 mmol/L (ref 135–145)
Total Bilirubin: 0.8 mg/dL (ref 0.0–1.2)
Total Protein: 6.7 g/dL (ref 6.5–8.1)

## 2023-08-12 LAB — CBC
HCT: 40.8 % (ref 39.0–52.0)
Hemoglobin: 13.8 g/dL (ref 13.0–17.0)
MCH: 30 pg (ref 26.0–34.0)
MCHC: 33.8 g/dL (ref 30.0–36.0)
MCV: 88.7 fL (ref 80.0–100.0)
Platelets: 216 10*3/uL (ref 150–400)
RBC: 4.6 MIL/uL (ref 4.22–5.81)
RDW: 12.5 % (ref 11.5–15.5)
WBC: 6.4 10*3/uL (ref 4.0–10.5)
nRBC: 0 % (ref 0.0–0.2)

## 2023-08-12 LAB — LIPASE, BLOOD: Lipase: 53 U/L — ABNORMAL HIGH (ref 11–51)

## 2023-08-12 LAB — URINALYSIS, ROUTINE W REFLEX MICROSCOPIC
Bilirubin Urine: NEGATIVE
Glucose, UA: NEGATIVE mg/dL
Hgb urine dipstick: NEGATIVE
Ketones, ur: NEGATIVE mg/dL
Nitrite: NEGATIVE
Protein, ur: NEGATIVE mg/dL
Specific Gravity, Urine: 1.021 (ref 1.005–1.030)
pH: 5 (ref 5.0–8.0)

## 2023-08-12 LAB — TROPONIN I (HIGH SENSITIVITY)
Troponin I (High Sensitivity): 3 ng/L (ref <18)
Troponin I (High Sensitivity): 2 ng/L (ref <18)

## 2023-08-12 MED ORDER — IOHEXOL 350 MG/ML SOLN
100.0000 mL | Freq: Once | INTRAVENOUS | Status: AC | PRN
  Administered 2023-08-12: 100 mL via INTRAVENOUS

## 2023-08-12 MED ORDER — ALUM & MAG HYDROXIDE-SIMETH 200-200-20 MG/5ML PO SUSP
30.0000 mL | Freq: Once | ORAL | Status: AC
  Administered 2023-08-12: 30 mL via ORAL
  Filled 2023-08-12: qty 30

## 2023-08-12 MED ORDER — LIDOCAINE 5 % EX PTCH
1.0000 | MEDICATED_PATCH | CUTANEOUS | Status: DC
  Administered 2023-08-12: 1 via TRANSDERMAL
  Filled 2023-08-12: qty 1

## 2023-08-12 MED ORDER — ONDANSETRON 4 MG PO TBDP: 4.0000 mg | ORAL_TABLET | Freq: Three times a day (TID) | ORAL | 0 refills | Status: AC | PRN

## 2023-08-12 MED ORDER — LIDOCAINE 5 % EX PTCH: 1.0000 | MEDICATED_PATCH | CUTANEOUS | 0 refills | Status: AC

## 2023-08-12 MED ORDER — PREDNISONE 20 MG PO TABS: 40.0000 mg | ORAL_TABLET | Freq: Every day | ORAL | 0 refills | Status: AC

## 2023-08-12 MED ORDER — PANTOPRAZOLE SODIUM 40 MG PO TBEC: 40.0000 mg | DELAYED_RELEASE_TABLET | Freq: Every day | ORAL | 0 refills | Status: DC

## 2023-08-12 MED ORDER — SUCRALFATE 1 G PO TABS: 1.0000 g | ORAL_TABLET | Freq: Three times a day (TID) | ORAL | 0 refills | Status: DC
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
## 2023-08-12 NOTE — Discharge Instructions (Addendum)
  Need to call neurologist to make follow up and neurosurg to make follow up Return to ER for worsening symptoms or any other concerns.   IMPRESSION: 2 mm echogenic focus towards the neck of the gallbladder is compatible with a tiny gallstone. No sonographic evidence for acute cholecystitis.  IMPRESSION: 1. Subtle deviation of the upper thoracic spinal cord ventrally at the T3 level - also on series 1 image 8 of the Cervical MRI today (reported separately). 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   2. Chronically exaggerated upper thoracic kyphosis, perhaps increased since 2020, but maintained vertebral height and normal marrow signal.   3. Small thoracic disc herniations at T6-T7, T7-T8, and T8-T9. No thoracic spinal or foraminal stenosis.

## 2023-08-12 NOTE — ED Triage Notes (Addendum)
 Patient C/O mid upper back pain that radiates up his shoulders that began about three weeks ago. He also is C/O of abdominal pain that also started three weeks ago. He describes the pain as a feeling of indigestion, but gets no relief with antacid use. Patient denies any injury to his back or shoulders, but does work in a physically demanding job.

## 2023-08-12 NOTE — ED Provider Notes (Signed)
 [facility] Provider Note    Event Date/Time   First MD Initiated Contact with Patient 08/12/23 [phone]     (approximate)   History   Abdominal Pain and Back Pain   HPI  Phillip Patterson is a 33 y.o. male with history of intractable migraines on ubrelvy and gabapentin, CTD with hypermobility who comes in with abdominal pain, back pain.  On review of records patient does have a history of vertebral dissection thought to be secondary to trauma but was unclear origin.  On review of records patient was seen on 07/20/2023 for thoracic back pain and neurology offered an x-ray but patient declined.  Pt has had prior MRI lumbar spine and MRI brain that has been negative  Patient reports upper back pain that radiates up to his shoulders that began about 3 weeks ago.  He reports that the pain has gotten worse to the point where now where he lays down flat he has a tingling sensation in his arms.  He denies ever having anything like this before although with his complex migraines he had some tingling but it was usually only on one side.  He reports now also developing some upper abdominal pain as well.  States it is worse after eating.  He denies any significant alcohol, smoking, NSAID use.  Physical Exam   Triage Vital Signs: ED Triage Vitals  Encounter Vitals Group     BP 08/12/23 0656 113/74     Systolic BP Percentile --      Diastolic BP Percentile --      Pulse Rate 08/12/23 0656 61     Resp 08/12/23 0656 18     Temp 08/12/23 0656 98 F (36.7 C)     Temp Source 08/12/23 0656 Oral     SpO2 08/12/23 0656 100 %     Weight 08/12/23 0657 145 lb (65.8 kg)     Height 08/12/23 0657 6\' 1"  (1.854 m)     Head Circumference --      Peak Flow --      Pain Score --      Pain Loc --      Pain Education --      Exclude from Growth Chart --     Most recent vital signs: Vitals:   08/12/23 0656  BP: 113/74  Pulse: 61  Resp: 18  Temp: 98 F (36.7 C)  SpO2: 100%      General: Awake, no distress.  CV:  Good peripheral perfusion.  Resp:  Normal effort.  Abd:  No distention.  Other:  Good distal pulses throughout.  Is got tenderness in his upper abdomen.  He is got some thoracic tenderness no obvious abrasions   ED Results / Procedures / Treatments   Labs (all labs ordered are listed, but only abnormal results are displayed) Labs Reviewed  CBC  LIPASE, BLOOD  COMPREHENSIVE METABOLIC PANEL WITH GFR  URINALYSIS, ROUTINE W REFLEX MICROSCOPIC     EKG  My interpretation of EKG:  Sinus bradycardia rate of 55, with some diffuse ST elevation looking more consistent with early reppol, no reciprocal changes no T wave inversions.  I reviewed prior EKG back in 2019 where patient had some similar ST elevation.  RADIOLOGY I have reviewed the ultrasound personally and interpreted and patient does have some possible gallstones   PROCEDURES:  Critical Care performed: No  .1-3 Lead EKG Interpretation  Performed by: Lubertha Rush, MD Authorized by: Lubertha Rush, MD  Interpretation: normal     ECG rate:  60   ECG rate assessment: normal     Rhythm: sinus rhythm     Ectopy: none     Conduction: normal      MEDICATIONS ORDERED IN ED: Medications  lidocaine  (LIDODERM ) 5 % 1 patch (1 patch Transdermal Patch Applied 08/12/23 0752)  alum & mag hydroxide-simeth (MAALOX/MYLANTA) 200-200-20 MG/5ML suspension 30 mL (30 mLs Oral Given 08/12/23 0752)     IMPRESSION / MDM / ASSESSMENT AND PLAN / ED COURSE  I reviewed the triage vital signs and the nursing notes.   Patient's presentation is most consistent with acute presentation with potential threat to life or bodily function.   Patient comes in with concerns for back pain, abdominal pain.  Given patient's history of connective tissue disease in the setting of prior vertebral dissection thought to be secondary to trauma but origin unclear I have decided to get CT imaging to rule out any type of  aortic dissection as well as a thoracic reformat given he reports upper abdominal pain.  Also ordered ultrasound given he reports pain in the upper abdomen after eating.  Will treat patient with some lidocaine  patch and Maalox.  EKG with some diffuse ST elevation but he has had that previously he denies really any chest pain is all epigastric pain.  To get cardiac markers to ensure no evidence of ACS although this seems less likely and these were negative x 2.  Symptoms are not consistent with pericarditis.  CBC reassuring.  UA without obvious signs of UTI.  Lipase slightly elevated.  CMP reassuring   IMPRESSION: 1. No thoracoabdominal aortic aneurysm. No dissection of the thoracoabdominal aorta. 2. No evidence for pulmonary embolus. 3. No acute findings in the abdomen or pelvis to explain the patient's history of pain.  IMPRESSION: 2 mm echogenic focus towards the neck of the gallbladder is compatible with a tiny gallstone. No sonographic evidence for acute cholecystitis.    Discussed with patient after the Maalox patient had significant resolution of symptoms of his upper abdominal pain.  We discussed the gallstones on his ultrasound and he can follow-up with surgery for outpatient discussion.  I wonder if there is a component of gastritis however given he reports significant resolution with Maalox.  Will trial PPI, Carafate and do outpatient follow-up with surgery.  I do not feel that he is got cholecystitis given no fever no white count and ultrasound is reassuring.  For his back pain I discussed the case with Dr. Ada Holler for outpatient follow-up given he reports that his arms go numb with straightening his spine.  He did recommend that we get an MRI here to ensure no other acute pathology that they can help follow-up with outpatient  IMPRESSION: 1. Subtle deviation of the upper thoracic spinal cord ventrally at the T3 level - also on series 1 image 8 of the Cervical MRI today (reported  separately). 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   2. Chronically exaggerated upper thoracic kyphosis, perhaps increased since 2020, but maintained vertebral height and normal marrow signal.   3. Small thoracic disc herniations at T6-T7, T7-T8, and T8-T9. No thoracic spinal or foraminal stenosis.  IMPRESSION: 1. Largely stable since 2019 and normal for age MRI appearance of the Cervical spine. 2. Evidence of healed left vertebral artery dissection as on prior CTAs.  Discussed with Dr. Ada Holler he states that the MRI is a little abnormal with T2 cord signaling changes-he will have him followed  up outpatient with neurosurgery no acute interventions.  Does recommend that we touch base with neurology to make sure there is nothing that needs to be done emergently from a neurological perspective.  Discussed with Dr Cleone Dad thought it was tethering does not need emergent neuro intervention.  Was okay with patient following up outpatient with the neurologist  Patient already has a neurologist that he can follow-up with.  He feels comfortable with this plan he has no weakness no numbness or tingling at this time and feels comfortable with discharge home.  Dr. Ada Holler did mention trialing some steroids which we discussed trialing for a few days.  He expressed understanding and felt comfortable with plan  The patient is on the cardiac monitor to evaluate for evidence of arrhythmia and/or significant heart rate changes.      FINAL CLINICAL IMPRESSION(S) / ED DIAGNOSES   Final diagnoses:  Acute midline thoracic back pain  Epigastric pain  Gallstones     Rx / DC Orders   ED Discharge Orders     None        Note:  This document was prepared using Dragon voice recognition software and may include unintentional dictation errors.   Lubertha Rush, MD 08/12/23 [phone]

## 2023-08-15 ENCOUNTER — Telehealth: Payer: Self-pay

## 2023-08-15 NOTE — Telephone Encounter (Signed)
 Thoughts?

## 2023-08-15 NOTE — Telephone Encounter (Signed)
-----   Message from [place] B sent at 08/15/2023  2:32 PM EDT ----- Regarding: ER F/U Please see below impression and advise whether to schedule with a PA or MD.   IMPRESSION: 1. Subtle deviation of the upper thoracic spinal cord ventrally at the T3 level - also on series 1 image 8 of the Cervical MRI today (reported separately). 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   2. Chronically exaggerated upper thoracic kyphosis, perhaps increased since 2020, but maintained vertebral height and normal marrow signal.   3. Small thoracic disc herniations at T6-T7, T7-T8, and T8-T9. No thoracic spinal or foraminal stenosis.
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
## 2023-08-15 NOTE — Telephone Encounter (Signed)
 I would schedule with MD.

## 2023-08-16 ENCOUNTER — Ambulatory Visit: Payer: Self-pay | Admitting: General Surgery
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
## 2023-08-22 ENCOUNTER — Encounter
Admission: RE | Admit: 2023-08-22 | Discharge: 2023-08-22 | Disposition: A | Discharge status: Home / Self Care | Source: Ambulatory Visit | Attending: General Surgery | Admitting: General Surgery

## 2023-08-22 ENCOUNTER — Other Ambulatory Visit: Payer: Self-pay

## 2023-08-22 HISTORY — DX: Personal history of other diseases of the musculoskeletal system and connective tissue: Z87.39

## 2023-08-22 HISTORY — DX: Dissection of vertebral artery: I77.74

## 2023-08-22 HISTORY — DX: Restless legs syndrome: G25.81

## 2023-08-22 HISTORY — DX: Depression, unspecified: F32.A

## 2023-08-22 HISTORY — DX: Calculus of gallbladder without cholecystitis without obstruction: K80.20

## 2023-08-22 HISTORY — DX: Migraine, unspecified, not intractable, without status migrainosus: G43.909

## 2023-08-22 HISTORY — DX: Unspecified asthma, uncomplicated: J45.909

## 2023-08-22 HISTORY — DX: Tremor, unspecified: R25.1

## 2023-08-22 HISTORY — DX: Unspecified urethral stricture, male, unspecified site: N35.919

## 2023-08-22 HISTORY — DX: Insomnia, unspecified: G47.00

## 2023-08-22 HISTORY — DX: Vitamin D deficiency, unspecified: E55.9

## 2023-08-22 HISTORY — DX: Gastro-esophageal reflux disease without esophagitis: K21.9
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
## 2023-08-22 NOTE — Patient Instructions (Signed)
 Your procedure is scheduled on:08-29-23 Monday Report to the Registration Desk on the 1st floor of the Medical Mall.Then proceed to the 2nd floor Surgery Desk To find out your arrival time, please call [phone] between 1PM - 3PM on:08-26-23 Friday If your arrival time is 6:00 am, do not arrive before that time as the Medical Mall entrance doors do not open until 6:00 am.  REMEMBER: Instructions that are not followed completely may result in serious medical risk, up to and including death; or upon the discretion of your surgeon and anesthesiologist your surgery may need to be rescheduled.  Do not eat food OR drink any liquids after midnight the night before surgery.  No gum chewing or hard candies.  One week prior to surgery:Stop NOW (08-22-23) Stop Anti-inflammatories (NSAIDS) such as Advil , Aleve, Ibuprofen , Motrin , Naproxen, Naprosyn and Aspirin  based products such as Excedrin, Goody's Powder, BC Powder. Stop ANY OVER THE COUNTER supplements until after surgery.  You may however, continue to take Tylenol if needed for pain up until the day of surgery.  Continue taking all of your other prescription medications up until the day of surgery.  ON THE DAY OF SURGERY ONLY TAKE THESE MEDICATIONS WITH SIPS OF WATER: -gabapentin (NEURONTIN)   No Alcohol for 24 hours before or after surgery.  No Smoking including e-cigarettes for 24 hours before surgery.  No chewable tobacco products for at least 6 hours before surgery.  No nicotine patches on the day of surgery.  Do not use any "recreational" drugs for at least a week (preferably 2 weeks) before your surgery.  Please be advised that the combination of cocaine and anesthesia may have negative outcomes, up to and including death. If you test positive for cocaine, your surgery will be cancelled.  On the morning of surgery brush your teeth with toothpaste and water, you may rinse your mouth with mouthwash if you wish. Do not swallow any  toothpaste or mouthwash.  Use CHG Soap as directed on instruction sheet.  Do not wear jewelry, make-up, hairpins, clips or nail polish.  For welded (permanent) jewelry: bracelets, anklets, waist bands, etc.  Please have this removed prior to surgery.  If it is not removed, there is a chance that hospital personnel will need to cut it off on the day of surgery.  Do not wear lotions, powders, or perfumes.   Do not shave body hair from the neck down 48 hours before surgery.  Contact lenses, hearing aids and dentures may not be worn into surgery.  Do not bring valuables to the hospital. [facility] is not responsible for any missing/lost belongings or valuables.   Notify your doctor if there is any change in your medical condition (cold, fever, infection).  Wear comfortable clothing (specific to your surgery type) to the hospital.  After surgery, you can help prevent lung complications by doing breathing exercises.  Take deep breaths and cough every 1-2 hours. Your doctor may order a device called an Incentive Spirometer to help you take deep breaths. When coughing or sneezing, hold a pillow firmly against your incision with both hands. This is called "splinting." Doing this helps protect your incision. It also decreases belly discomfort.  If you are being admitted to the hospital overnight, leave your suitcase in the car. After surgery it may be brought to your room.  In case of increased patient census, it may be necessary for you, the patient, to continue your postoperative care in the Same Day Surgery department.  If  you are being discharged the day of surgery, you will not be allowed to drive home. You will need a responsible individual to drive you home and stay with you for 24 hours after surgery.   If you are taking public transportation, you will need to have a responsible individual with you.  Please call the Pre-admissions Testing Dept. at [phone] if you have any  questions about these instructions.  Surgery Visitation Policy:  Patients having surgery or a procedure may have two visitors.  Children under the age of 74 must have an adult with them who is not the patient.     Preparing for Surgery with CHLORHEXIDINE GLUCONATE (CHG) Soap  Chlorhexidine Gluconate (CHG) Soap  o An antiseptic cleaner that kills germs and bonds with the skin to continue killing germs even after washing  o Used for showering the night before surgery and morning of surgery  Before surgery, you can play an important role by reducing the number of germs on your skin.  CHG (Chlorhexidine gluconate) soap is an antiseptic cleanser which kills germs and bonds with the skin to continue killing germs even after washing.  Please do not use if you have an allergy to CHG or antibacterial soaps. If your skin becomes reddened/irritated stop using the CHG.  1. Shower the NIGHT BEFORE SURGERY and the MORNING OF SURGERY with CHG soap.  2. If you choose to wash your hair, wash your hair first as usual with your normal shampoo.  3. After shampooing, rinse your hair and body thoroughly to remove the shampoo.  4. Use CHG as you would any other liquid soap. You can apply CHG directly to the skin and wash gently with a scrungie or a clean washcloth.  5. Apply the CHG soap to your body only from the neck down. Do not use on open wounds or open sores. Avoid contact with your eyes, ears, mouth, and genitals (private parts). Wash face and genitals (private parts) with your normal soap.  6. Wash thoroughly, paying special attention to the area where your surgery will be performed.  7. Thoroughly rinse your body with warm water.  8. Do not shower/wash with your normal soap after using and rinsing off the CHG soap.  9. Pat yourself dry with a clean towel.  10. Wear clean pajamas to bed the night before surgery.  12. Place clean sheets on your bed the night of your first shower and do not  sleep with pets.  13. Shower again with the CHG soap on the day of surgery prior to arriving at the hospital.  14. Do not apply any deodorants/lotions/powders.  15. Please wear clean clothes to the hospital.

## 2023-08-23 ENCOUNTER — Encounter: Payer: Self-pay | Admitting: Neurosurgery

## 2023-08-23 ENCOUNTER — Ambulatory Visit (INDEPENDENT_AMBULATORY_CARE_PROVIDER_SITE_OTHER): Admitting: Neurosurgery

## 2023-08-23 VITALS — BP 104/76 | Ht 73.0 in | Wt 146.0 lb

## 2023-08-23 DIAGNOSIS — G96198 Other disorders of meninges, not elsewhere classified: Secondary | ICD-10-CM | POA: Diagnosis not present

## 2023-08-29 ENCOUNTER — Other Ambulatory Visit: Payer: Self-pay

## 2023-08-29 ENCOUNTER — Encounter
Admission: RE | Disposition: A | Discharge status: Home / Self Care | Payer: Self-pay | Source: Home / Self Care | Attending: General Surgery

## 2023-08-29 ENCOUNTER — Ambulatory Visit: Admitting: Anesthesiology

## 2023-08-29 ENCOUNTER — Encounter: Payer: Self-pay | Admitting: General Surgery

## 2023-08-29 ENCOUNTER — Ambulatory Visit
Admission: RE | Admit: 2023-08-29 | Discharge: 2023-08-29 | Disposition: A | Discharge status: Home / Self Care | Attending: General Surgery | Admitting: General Surgery

## 2023-08-29 DIAGNOSIS — Z87891 Personal history of nicotine dependence: Secondary | ICD-10-CM | POA: Diagnosis not present

## 2023-08-29 DIAGNOSIS — K219 Gastro-esophageal reflux disease without esophagitis: Secondary | ICD-10-CM | POA: Diagnosis not present

## 2023-08-29 DIAGNOSIS — J45909 Unspecified asthma, uncomplicated: Secondary | ICD-10-CM | POA: Diagnosis not present

## 2023-08-29 DIAGNOSIS — K8010 Calculus of gallbladder with chronic cholecystitis without obstruction: Secondary | ICD-10-CM | POA: Diagnosis present

## 2023-08-29 SURGERY — CHOLECYSTECTOMY, ROBOT-ASSISTED, LAPAROSCOPIC
Anesthesia: General | Site: Abdomen

## 2023-08-29 MED ORDER — FENTANYL CITRATE (PF) 100 MCG/2ML IJ SOLN
INTRAMUSCULAR | Status: AC
  Filled 2023-08-29: qty 2

## 2023-08-29 MED ORDER — DEXMEDETOMIDINE HCL IN NACL 80 MCG/20ML IV SOLN
INTRAVENOUS | Status: DC | PRN
  Administered 2023-08-29 (×2): 8 ug via INTRAVENOUS
  Administered 2023-08-29 (×4): 4 ug via INTRAVENOUS

## 2023-08-29 MED ORDER — 0.9 % SODIUM CHLORIDE (POUR BTL) OPTIME
TOPICAL | Status: DC | PRN
  Administered 2023-08-29: 500 mL

## 2023-08-29 MED ORDER — LIDOCAINE HCL (CARDIAC) PF 100 MG/5ML IV SOSY
PREFILLED_SYRINGE | INTRAVENOUS | Status: DC | PRN
Start: 2023-08-29 — End: 2023-08-29
  Administered 2023-08-29: 100 mg via INTRAVENOUS

## 2023-08-29 MED ORDER — EPHEDRINE SULFATE-NACL 50-0.9 MG/10ML-% IV SOSY
PREFILLED_SYRINGE | INTRAVENOUS | Status: DC | PRN
  Administered 2023-08-29: 5 mg via INTRAVENOUS

## 2023-08-29 MED ORDER — OXYCODONE HCL 5 MG/5ML PO SOLN: 5.0000 mg | Freq: Once | ORAL | Status: AC | PRN

## 2023-08-29 MED ORDER — OXYCODONE HCL 5 MG PO TABS
5.0000 mg | ORAL_TABLET | Freq: Once | ORAL | Status: AC | PRN
  Administered 2023-08-29: 5 mg via ORAL

## 2023-08-29 MED ORDER — MIDAZOLAM HCL 2 MG/2ML IJ SOLN
INTRAMUSCULAR | Status: AC
  Filled 2023-08-29: qty 2

## 2023-08-29 MED ORDER — ACETAMINOPHEN 500 MG PO TABS
ORAL_TABLET | ORAL | Status: AC
  Filled 2023-08-29: qty 2

## 2023-08-29 MED ORDER — SUGAMMADEX SODIUM 200 MG/2ML IV SOLN
INTRAVENOUS | Status: DC | PRN
  Administered 2023-08-29: 150 mg via INTRAVENOUS

## 2023-08-29 MED ORDER — BUPIVACAINE-EPINEPHRINE 0.25% -1:200000 IJ SOLN
INTRAMUSCULAR | Status: DC | PRN
  Administered 2023-08-29: 30 mL

## 2023-08-29 MED ORDER — PROPOFOL 10 MG/ML IV BOLUS
INTRAVENOUS | Status: AC
  Filled 2023-08-29: qty 40

## 2023-08-29 MED ORDER — CHLORHEXIDINE GLUCONATE 0.12 % MT SOLN
15.0000 mL | Freq: Once | OROMUCOSAL | Status: AC
  Administered 2023-08-29: 15 mL via OROMUCOSAL

## 2023-08-29 MED ORDER — CHLORHEXIDINE GLUCONATE 0.12 % MT SOLN
OROMUCOSAL | Status: AC
  Filled 2023-08-29: qty 15

## 2023-08-29 MED ORDER — GABAPENTIN 300 MG PO CAPS: 300.0000 mg | ORAL_CAPSULE | Freq: Once | ORAL | Status: DC

## 2023-08-29 MED ORDER — ROCURONIUM BROMIDE 100 MG/10ML IV SOLN
INTRAVENOUS | Status: DC | PRN
  Administered 2023-08-29: 40 mg via INTRAVENOUS
  Administered 2023-08-29: 20 mg via INTRAVENOUS

## 2023-08-29 MED ORDER — CEFAZOLIN SODIUM-DEXTROSE 2-4 GM/100ML-% IV SOLN
2.0000 g | INTRAVENOUS | Status: AC
  Administered 2023-08-29: 2 g via INTRAVENOUS

## 2023-08-29 MED ORDER — ACETAMINOPHEN 10 MG/ML IV SOLN: 1000.0000 mg | Freq: Once | INTRAVENOUS | Status: DC | PRN

## 2023-08-29 MED ORDER — PROPOFOL 10 MG/ML IV BOLUS
INTRAVENOUS | Status: DC | PRN
  Administered 2023-08-29: 180 mg via INTRAVENOUS

## 2023-08-29 MED ORDER — GABAPENTIN 300 MG PO CAPS
ORAL_CAPSULE | ORAL | Status: AC
  Filled 2023-08-29: qty 1

## 2023-08-29 MED ORDER — OXYCODONE HCL 5 MG PO TABS
ORAL_TABLET | ORAL | Status: AC
  Filled 2023-08-29: qty 1

## 2023-08-29 MED ORDER — CELECOXIB 200 MG PO CAPS
ORAL_CAPSULE | ORAL | Status: AC
  Filled 2023-08-29: qty 1

## 2023-08-29 MED ORDER — ORAL CARE MOUTH RINSE: 15.0000 mL | Freq: Once | OROMUCOSAL | Status: AC

## 2023-08-29 MED ORDER — ACETAMINOPHEN 500 MG PO TABS
1000.0000 mg | ORAL_TABLET | Freq: Once | ORAL | Status: AC
  Administered 2023-08-29: 1000 mg via ORAL

## 2023-08-29 MED ORDER — CEFAZOLIN SODIUM-DEXTROSE 2-4 GM/100ML-% IV SOLN
INTRAVENOUS | Status: AC
  Filled 2023-08-29: qty 100

## 2023-08-29 MED ORDER — FENTANYL CITRATE (PF) 100 MCG/2ML IJ SOLN
INTRAMUSCULAR | Status: DC | PRN
  Administered 2023-08-29: 25 ug via INTRAVENOUS
  Administered 2023-08-29: 50 ug via INTRAVENOUS
  Administered 2023-08-29: 25 ug via INTRAVENOUS
  Administered 2023-08-29 (×2): 50 ug via INTRAVENOUS

## 2023-08-29 MED ORDER — LACTATED RINGERS IV SOLN: INTRAVENOUS | Status: DC

## 2023-08-29 MED ORDER — HYDROCODONE-ACETAMINOPHEN 5-325 MG PO TABS: 1.0000 | ORAL_TABLET | Freq: Four times a day (QID) | ORAL | 0 refills | Status: AC | PRN

## 2023-08-29 MED ORDER — FENTANYL CITRATE (PF) 100 MCG/2ML IJ SOLN: 25.0000 ug | INTRAMUSCULAR | Status: DC | PRN

## 2023-08-29 MED ORDER — BUPIVACAINE-EPINEPHRINE (PF) 0.25% -1:200000 IJ SOLN
INTRAMUSCULAR | Status: AC
  Filled 2023-08-29: qty 30

## 2023-08-29 MED ORDER — INDOCYANINE GREEN 25 MG IV SOLR
1.2500 mg | Freq: Once | INTRAVENOUS | Status: AC
Start: 2023-08-29 — End: 2023-08-29
  Administered 2023-08-29: 1.25 mg via INTRAVENOUS

## 2023-08-29 MED ORDER — INDOCYANINE GREEN 25 MG IV SOLR
INTRAVENOUS | Status: AC
  Filled 2023-08-29: qty 10

## 2023-08-29 MED ORDER — DROPERIDOL 2.5 MG/ML IJ SOLN: 0.6250 mg | Freq: Once | INTRAMUSCULAR | Status: DC | PRN

## 2023-08-29 MED ORDER — CELECOXIB 200 MG PO CAPS
200.0000 mg | ORAL_CAPSULE | Freq: Once | ORAL | Status: AC
  Administered 2023-08-29: 200 mg via ORAL

## 2023-08-29 MED ORDER — DEXAMETHASONE SODIUM PHOSPHATE 10 MG/ML IJ SOLN
INTRAMUSCULAR | Status: DC | PRN
  Administered 2023-08-29: 10 mg via INTRAVENOUS

## 2023-08-29 MED ORDER — MIDAZOLAM HCL 2 MG/2ML IJ SOLN
INTRAMUSCULAR | Status: DC | PRN
  Administered 2023-08-29: 2 mg via INTRAVENOUS

## 2023-08-29 SURGICAL SUPPLY — 44 items
BAG PRESSURE INF REUSE 1000 (BAG) IMPLANT
CANNULA REDUCER 12-8 DVNC XI (CANNULA) ×1 IMPLANT
CATH REDDICK CHOLANGI 4FR 50CM (CATHETERS) IMPLANT
CAUTERY HOOK MNPLR 1.6 DVNC XI (INSTRUMENTS) ×1 IMPLANT
CLIP LIGATING HEM O LOK PURPLE (MISCELLANEOUS) IMPLANT
CLIP LIGATING HEMO O LOK GREEN (MISCELLANEOUS) ×1 IMPLANT
DERMABOND ADVANCED .7 DNX12 (GAUZE/BANDAGES/DRESSINGS) ×1 IMPLANT
DRAPE ARM DVNC X/XI (DISPOSABLE) ×4 IMPLANT
DRAPE C-ARM XRAY 36X54 (DRAPES) IMPLANT
DRAPE COLUMN DVNC XI (DISPOSABLE) ×1 IMPLANT
ELECTRODE REM PT RTRN 9FT ADLT (ELECTROSURGICAL) ×1 IMPLANT
FORCEPS BPLR 8 MD DVNC XI (FORCEP) ×1 IMPLANT
FORCEPS BPLR FENES DVNC XI (FORCEP) ×1 IMPLANT
FORCEPS PROGRASP DVNC XI (FORCEP) ×1 IMPLANT
GLOVE BIO SURGEON STRL SZ 6.5 (GLOVE) ×2 IMPLANT
GLOVE BIOGEL PI IND STRL 6.5 (GLOVE) ×2 IMPLANT
GLOVE SURG SYN 6.5 ES PF (GLOVE) ×2 IMPLANT
GLOVE SURG SYN 6.5 PF PI (GLOVE) ×2 IMPLANT
GOWN STRL REUS W/ TWL LRG LVL3 (GOWN DISPOSABLE) ×4 IMPLANT
GRASPER SUT TROCAR 14GX15 (MISCELLANEOUS) ×1 IMPLANT
IRRIGATOR SUCT 8 DISP DVNC XI (IRRIGATION / IRRIGATOR) IMPLANT
IV CATH ANGIO 12GX3 LT BLUE (NEEDLE) IMPLANT
IV NS 1000ML BAXH (IV SOLUTION) IMPLANT
KIT PINK PAD W/HEAD ARE REST (MISCELLANEOUS) ×1 IMPLANT
KIT PINK PAD W/HEAD ARM REST (MISCELLANEOUS) ×1 IMPLANT
LABEL OR SOLS (LABEL) ×1 IMPLANT
MANIFOLD NEPTUNE II (INSTRUMENTS) ×1 IMPLANT
NDL HYPO 22X1.5 SAFETY MO (MISCELLANEOUS) ×1 IMPLANT
NDL INSUFFLATION 14GA 120MM (NEEDLE) ×1 IMPLANT
NEEDLE HYPO 22X1.5 SAFETY MO (MISCELLANEOUS) ×1 IMPLANT
NEEDLE INSUFFLATION 14GA 120MM (NEEDLE) ×1 IMPLANT
NS IRRIG 500ML POUR BTL (IV SOLUTION) ×1 IMPLANT
OBTURATOR OPTICALSTD 8 DVNC (TROCAR) ×1 IMPLANT
PACK LAP CHOLECYSTECTOMY (MISCELLANEOUS) ×1 IMPLANT
SEAL UNIV 5-12 XI (MISCELLANEOUS) ×4 IMPLANT
SET TUBE SMOKE EVAC HIGH FLOW (TUBING) ×1 IMPLANT
SOLUTION ELECTROSURG ANTI STCK (MISCELLANEOUS) ×1 IMPLANT
SPIKE FLUID TRANSFER (MISCELLANEOUS) ×2 IMPLANT
SPONGE T-LAP 4X18 ~~LOC~~+RFID (SPONGE) IMPLANT
SUT VICRYL 0 UR6 27IN ABS (SUTURE) ×1 IMPLANT
SUTURE MNCRL 4-0 27XMF (SUTURE) ×1 IMPLANT
SYSTEM BAG RETRIEVAL 10MM (BASKET) ×1 IMPLANT
TRAP FLUID SMOKE EVACUATOR (MISCELLANEOUS) ×1 IMPLANT
WATER STERILE IRR 500ML POUR (IV SOLUTION) ×1 IMPLANT
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
## 2023-08-29 NOTE — Anesthesia Postprocedure Evaluation (Signed)
 Anesthesia Post Note  Patient: Phillip Patterson  Procedure(s) Performed: CHOLECYSTECTOMY, ROBOT-ASSISTED, LAPAROSCOPIC (Abdomen)  Patient location during evaluation: PACU Anesthesia Type: General Level of consciousness: awake and alert Pain management: pain level controlled Vital Signs Assessment: post-procedure vital signs reviewed and stable Respiratory status: spontaneous breathing, nonlabored ventilation and respiratory function stable Cardiovascular status: blood pressure returned to baseline and stable Postop Assessment: no apparent nausea or vomiting Anesthetic complications: no   No notable events documented.   Last Vitals:  Vitals:   08/29/23 1219 08/29/23 1221  BP:  121/81  Pulse: (!) 58   Resp: 16   Temp: (!) 36.1 C   SpO2: 100%     Last Pain:  Vitals:   08/29/23 1219  TempSrc: Temporal  PainSc: 5                  Baltazar Bonier

## 2023-08-29 NOTE — Transfer of Care (Signed)
 Immediate Anesthesia Transfer of Care Note  Patient: Phillip Patterson  Procedure(s) Performed: CHOLECYSTECTOMY, ROBOT-ASSISTED, LAPAROSCOPIC (Abdomen)  Patient Location: PACU  Anesthesia Type:General  Level of Consciousness: drowsy and patient cooperative  Airway & Oxygen Therapy: Patient Spontanous Breathing and Patient connected to face mask oxygen  Post-op Assessment: Report given to RN, Post -op Vital signs reviewed and stable, and Patient moving all extremities X 4  Post vital signs: Reviewed and stable  Last Vitals:  Vitals Value Taken Time  BP 133/91 08/29/23 1036  Temp    Pulse 71 08/29/23 1039  Resp 19 08/29/23 1039  SpO2 100 % 08/29/23 1039  Vitals shown include unfiled device data.  Last Pain:  Vitals:   08/29/23 0830  TempSrc:   PainSc: 4          Complications: No notable events documented.

## 2023-08-29 NOTE — Op Note (Signed)
 Preoperative diagnosis: Cholelithiasis  Postoperative diagnosis: Same  Procedure: Robotic Assisted Laparoscopic Cholecystectomy.   Anesthesia: GETA   Surgeon: Dr. Dortha Gauss  Wound Classification: Clean Contaminated  Indications: Patient is a 33 y.o. male developed right upper quadrant pain and on workup was found to have cholelithiasis with a normal common duct. Robotic Assisted Laparoscopic cholecystectomy was elected.  Findings:  Critical view of safety achieved Cystic duct and artery identified, ligated and divided Adequate hemostasis       Description of procedure: The patient was placed on the operating table in the supine position. General anesthesia was induced. A time-out was completed verifying correct patient, procedure, site, positioning, and implant(s) and/or special equipment prior to beginning this procedure. An orogastric tube was placed. The abdomen was prepped and draped in the usual sterile fashion.  An incision was made in a natural skin line below the umbilicus.  The fascia was elevated and the Veress needle inserted. Proper position was confirmed by aspiration and saline meniscus test.  The abdomen was insufflated with carbon dioxide to a pressure of 15 mmHg. The patient tolerated insufflation well. A 8-mm trocar was then inserted in optiview fashion.  The laparoscope was inserted and the abdomen inspected. No injuries from initial trocar placement were noted. Additional trocars were then inserted in the following locations: an 8-mm trocar in the left lateral abdomen, and another two 8-mm trocars to the right side of the abdomen 5 cm appart. The umbilical trocar was changed to a 12 mm trocar all under direct visualization. The abdomen was inspected and no abnormalities were found. The table was placed in the reverse Trendelenburg position with the right side up. The robotic arms were docked and target anatomy identified. Instrument inserted under direct  visualization.  Filmy adhesions between the gallbladder and omentum, duodenum and transverse colon were lysed with electrocautery. The dome of the gallbladder was grasped with a prograsp and retracted over the dome of the liver. The infundibulum was also grasped with an atraumatic grasper and retracted toward the right lower quadrant. This maneuver exposed Calot's triangle. The peritoneum overlying the gallbladder infundibulum was then incised and the cystic duct and cystic artery identified and circumferentially dissected. Critical view of safety reviewed before ligating any structure. Firefly images taken to visualize biliary ducts. The cystic duct and cystic artery were then doubly clipped and divided close to the gallbladder.  The gallbladder was then dissected from its peritoneal attachments by electrocautery. Hemostasis was checked and the gallbladder and contained stones were removed using an endoscopic retrieval bag. The gallbladder was passed off the table as a specimen. There was no evidence of bleeding from the gallbladder fossa or cystic artery or leakage of the bile from the cystic duct stump. Secondary trocars were removed under direct vision. No bleeding was noted. The robotic arms were undoked. The scope was withdrawn and the umbilical trocar removed. The abdomen was allowed to collapse. The fascia of the 12mm trocar sites was closed with figure-of-eight 0 vicryl sutures. The skin was closed with subcuticular sutures of 4-0 monocryl and topical skin adhesive. The orogastric tube was removed.  The patient tolerated the procedure well and was taken to the postanesthesia care unit in stable condition.   Specimen: Gallbladder  Complications: None  EBL: 5 mL

## 2023-08-29 NOTE — Anesthesia Procedure Notes (Signed)
 Procedure Name: Intubation Date/Time: 08/29/2023 9:34 AM  Performed by: Noelia Batman, CRNAPre-anesthesia Checklist: Patient identified, Emergency Drugs available, Suction available and Patient being monitored Patient Re-evaluated:Patient Re-evaluated prior to induction Oxygen Delivery Method: Circle System Utilized Preoxygenation: Pre-oxygenation with 100% oxygen Induction Type: IV induction Ventilation: Mask ventilation without difficulty Laryngoscope Size: McGrath and 4 Grade View: Grade II Tube type: Oral Tube size: 7.5 mm Number of attempts: 1 Airway Equipment and Method: Stylet and Oral airway Placement Confirmation: ETT inserted through vocal cords under direct vision, positive ETCO2 and breath sounds checked- equal and bilateral Secured at: 23 cm Tube secured with: Tape Dental Injury: Teeth and Oropharynx as per pre-operative assessment

## 2023-08-29 NOTE — Discharge Instructions (Signed)

## 2023-08-29 NOTE — Interval H&P Note (Signed)
 History and Physical Interval Note:  08/29/2023 9:05 AM  Phillip Patterson  has presented today for surgery, with the diagnosis of K80.20 cholelithiasis w/o cholecystitis.  The various methods of treatment have been discussed with the patient and family. After consideration of risks, benefits and other options for treatment, the patient has consented to  Procedure(s): CHOLECYSTECTOMY, ROBOT-ASSISTED, LAPAROSCOPIC (N/A) as a surgical intervention.  The patient's history has been reviewed, patient examined, no change in status, stable for surgery.  I have reviewed the patient's chart and labs.  Questions were answered to the patient's satisfaction.     Eldred Grego

## 2023-08-30 LAB — SURGICAL PATHOLOGY
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
## 2023-11-24 ENCOUNTER — Ambulatory Visit
Admission: RE | Admit: 2023-11-24 | Discharge: 2023-11-24 | Disposition: A | Discharge status: Home / Self Care | Source: Ambulatory Visit | Attending: Neurosurgery | Admitting: Neurosurgery

## 2023-11-24 DIAGNOSIS — G96198 Other disorders of meninges, not elsewhere classified: Secondary | ICD-10-CM | POA: Insufficient documentation

## 2023-11-24 MED ORDER — GADOBUTROL 1 MMOL/ML IV SOLN
7.0000 mL | Freq: Once | INTRAVENOUS | Status: AC | PRN
  Administered 2023-11-24: 7 mL via INTRAVENOUS

## 2023-12-08 ENCOUNTER — Ambulatory Visit: Payer: Self-pay | Admitting: Neurosurgery

## 2023-12-08 NOTE — Telephone Encounter (Signed)
 I spoke to the patient about his results.  He does not have any increase in imbalance and has not had development of any numbness in his legs.  We discussed that worsening balance or worsening lower extremity function such as sensory changes or weakness would be the triggering event to consider surgical intervention.  For now, he will watch his symptoms.  He will let me know if any symptoms worsen.
